# Patient Record
Sex: Male | Born: 1975 | Race: Black or African American | Hispanic: No | Marital: Single | Smoking: Current every day smoker
Health system: Southern US, Community
[De-identification: ages and names within clinical notes are randomized; demographics above are authoritative.]

## PROBLEM LIST (undated history)

## (undated) DIAGNOSIS — I10 Essential (primary) hypertension: Secondary | ICD-10-CM

## (undated) DIAGNOSIS — F319 Bipolar disorder, unspecified: Secondary | ICD-10-CM

## (undated) DIAGNOSIS — F419 Anxiety disorder, unspecified: Secondary | ICD-10-CM

## (undated) DIAGNOSIS — J40 Bronchitis, not specified as acute or chronic: Secondary | ICD-10-CM

## (undated) DIAGNOSIS — F209 Schizophrenia, unspecified: Secondary | ICD-10-CM

## (undated) DIAGNOSIS — E785 Hyperlipidemia, unspecified: Secondary | ICD-10-CM

## (undated) DIAGNOSIS — Y249XXA Unspecified firearm discharge, undetermined intent, initial encounter: Secondary | ICD-10-CM

## (undated) DIAGNOSIS — W3400XA Accidental discharge from unspecified firearms or gun, initial encounter: Secondary | ICD-10-CM

## (undated) HISTORY — PX: FRACTURE SURGERY: SHX138

---

## 1997-07-08 ENCOUNTER — Emergency Department (HOSPITAL_COMMUNITY): Admission: EM | Admit: 1997-07-08 | Discharge: 1997-07-08 | Payer: Self-pay | Admitting: Emergency Medicine

## 1998-04-14 ENCOUNTER — Emergency Department (HOSPITAL_COMMUNITY): Admission: EM | Admit: 1998-04-14 | Discharge: 1998-04-14 | Payer: Self-pay | Admitting: Emergency Medicine

## 2001-03-31 ENCOUNTER — Emergency Department (HOSPITAL_COMMUNITY): Admission: EM | Admit: 2001-03-31 | Discharge: 2001-03-31 | Payer: Self-pay | Admitting: Emergency Medicine

## 2001-03-31 ENCOUNTER — Encounter: Payer: Self-pay | Admitting: Emergency Medicine

## 2001-05-11 ENCOUNTER — Emergency Department (HOSPITAL_COMMUNITY): Admission: EM | Admit: 2001-05-11 | Discharge: 2001-05-11 | Payer: Self-pay | Admitting: Emergency Medicine

## 2001-08-18 ENCOUNTER — Encounter (INDEPENDENT_AMBULATORY_CARE_PROVIDER_SITE_OTHER): Payer: Self-pay | Admitting: Specialist

## 2001-08-18 ENCOUNTER — Inpatient Hospital Stay (HOSPITAL_COMMUNITY): Admission: AC | Admit: 2001-08-18 | Discharge: 2001-08-29 | Payer: Self-pay

## 2001-08-18 ENCOUNTER — Encounter: Payer: Self-pay | Admitting: General Surgery

## 2001-08-23 ENCOUNTER — Encounter: Payer: Self-pay | Admitting: Surgery

## 2001-08-27 ENCOUNTER — Encounter: Payer: Self-pay | Admitting: General Surgery

## 2001-09-10 ENCOUNTER — Emergency Department (HOSPITAL_COMMUNITY): Admission: EM | Admit: 2001-09-10 | Discharge: 2001-09-11 | Payer: Self-pay | Admitting: Emergency Medicine

## 2001-10-07 ENCOUNTER — Emergency Department (HOSPITAL_COMMUNITY): Admission: EM | Admit: 2001-10-07 | Discharge: 2001-10-07 | Payer: Self-pay | Admitting: Emergency Medicine

## 2001-10-08 ENCOUNTER — Ambulatory Visit (HOSPITAL_BASED_OUTPATIENT_CLINIC_OR_DEPARTMENT_OTHER): Admission: RE | Admit: 2001-10-08 | Discharge: 2001-10-08 | Payer: Self-pay | Admitting: Orthopaedic Surgery

## 2001-11-12 ENCOUNTER — Encounter: Payer: Self-pay | Admitting: General Surgery

## 2001-11-12 ENCOUNTER — Emergency Department (HOSPITAL_COMMUNITY): Admission: EM | Admit: 2001-11-12 | Discharge: 2001-11-12 | Payer: Self-pay | Admitting: Emergency Medicine

## 2003-06-12 ENCOUNTER — Emergency Department (HOSPITAL_COMMUNITY): Admission: EM | Admit: 2003-06-12 | Discharge: 2003-06-12 | Payer: Self-pay | Admitting: Emergency Medicine

## 2007-04-24 ENCOUNTER — Emergency Department (HOSPITAL_COMMUNITY): Admission: EM | Admit: 2007-04-24 | Discharge: 2007-04-25 | Payer: Self-pay | Admitting: Emergency Medicine
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
## 2010-09-23 ENCOUNTER — Emergency Department (HOSPITAL_COMMUNITY)
Admission: EM | Admit: 2010-09-23 | Discharge: 2010-09-23 | Disposition: A | Discharge status: Home / Self Care | Payer: Self-pay | Attending: Emergency Medicine | Admitting: Emergency Medicine

## 2010-09-23 DIAGNOSIS — N342 Other urethritis: Secondary | ICD-10-CM | POA: Insufficient documentation

## 2010-09-23 DIAGNOSIS — R300 Dysuria: Secondary | ICD-10-CM | POA: Insufficient documentation

## 2010-09-23 LAB — RPR: RPR Ser Ql: NONREACTIVE

## 2010-09-24 LAB — GC/CHLAMYDIA PROBE AMP, GENITAL
Chlamydia, DNA Probe: NEGATIVE
GC Probe Amp, Genital: NEGATIVE

## 2013-04-06 ENCOUNTER — Emergency Department (HOSPITAL_COMMUNITY)
Admission: EM | Admit: 2013-04-06 | Discharge: 2013-04-06 | Disposition: A | Discharge status: Home / Self Care | Payer: No Typology Code available for payment source | Attending: Emergency Medicine | Admitting: Emergency Medicine

## 2013-04-06 ENCOUNTER — Encounter (HOSPITAL_COMMUNITY): Payer: Self-pay | Admitting: Emergency Medicine

## 2013-04-06 DIAGNOSIS — Y990 Civilian activity done for income or pay: Secondary | ICD-10-CM | POA: Insufficient documentation

## 2013-04-06 DIAGNOSIS — T23269A Burn of second degree of back of unspecified hand, initial encounter: Secondary | ICD-10-CM | POA: Insufficient documentation

## 2013-04-06 DIAGNOSIS — Y93G1 Activity, food preparation and clean up: Secondary | ICD-10-CM | POA: Insufficient documentation

## 2013-04-06 DIAGNOSIS — X19XXXA Contact with other heat and hot substances, initial encounter: Secondary | ICD-10-CM | POA: Insufficient documentation

## 2013-04-06 DIAGNOSIS — T23209A Burn of second degree of unspecified hand, unspecified site, initial encounter: Secondary | ICD-10-CM

## 2013-04-06 DIAGNOSIS — F17200 Nicotine dependence, unspecified, uncomplicated: Secondary | ICD-10-CM | POA: Insufficient documentation

## 2013-04-06 DIAGNOSIS — Y9289 Other specified places as the place of occurrence of the external cause: Secondary | ICD-10-CM | POA: Insufficient documentation

## 2013-04-06 MED ORDER — SILVER SULFADIAZINE 1 % EX CREA
TOPICAL_CREAM | Freq: Once | CUTANEOUS | Status: AC
  Administered 2013-04-06: 1 via TOPICAL
  Filled 2013-04-06: qty 85

## 2013-04-06 NOTE — Discharge Instructions (Signed)
Keep your burn clean and dry. Apply silvadene cream twice a day. No washing hands too frequently. Follow up with your doctor as needed.    Burn Care Your skin is a natural barrier to infection. It is the largest organ of your body. Burns damage this natural protection. To help prevent infection, it is very important to follow your caregiver's instructions in the care of your burn. Burns are classified as:  First degree. There is only redness of the skin (erythema). No scarring is expected.  Second degree. There is blistering of the skin. Scarring may occur with deeper burns.  Third degree. All layers of the skin are injured, and scarring is expected. HOME CARE INSTRUCTIONS   Wash your hands well before changing your bandage.  Change your bandage as often as directed by your caregiver.  Remove the old bandage. If the bandage sticks, you may soak it off with cool, clean water.  Cleanse the burn thoroughly but gently with mild soap and water.  Pat the area dry with a clean, dry cloth.  Apply a thin layer of antibacterial cream to the burn.  Apply a clean bandage as instructed by your caregiver.  Keep the bandage as clean and dry as possible.  Elevate the affected area for the first 24 hours, then as instructed by your caregiver.  Only take over-the-counter or prescription medicines for pain, discomfort, or fever as directed by your caregiver. SEEK IMMEDIATE MEDICAL CARE IF:   You develop excessive pain.  You develop redness, tenderness, swelling, or red streaks near the burn.  The burned area develops yellowish-white fluid (pus) or a bad smell.  You have a fever. MAKE SURE YOU:   Understand these instructions.  Will watch your condition.  Will get help right away if you are not doing well or get worse. Document Released: 03/03/2005 Document Revised: 05/26/2011 Document Reviewed: 07/24/2010 [facility] Patient Information 2014 [place], [place].
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
## 2013-04-07 NOTE — ED Provider Notes (Signed)
Medical screening examination/treatment/procedure(s) were performed by non-physician practitioner and as supervising physician I was immediately available for consultation/collaboration.  EKG Interpretation   None         Felton Buczynski B. Brixon Zhen, MD 04/07/13 0900 

## 2013-09-03 ENCOUNTER — Encounter (HOSPITAL_COMMUNITY): Payer: No Typology Code available for payment source | Admitting: Anesthesiology

## 2013-09-03 ENCOUNTER — Encounter (HOSPITAL_COMMUNITY)
Admission: EM | Disposition: A | Discharge status: Home / Self Care | Payer: Self-pay | Source: Home / Self Care | Attending: Emergency Medicine

## 2013-09-03 ENCOUNTER — Emergency Department (HOSPITAL_COMMUNITY): Payer: No Typology Code available for payment source

## 2013-09-03 ENCOUNTER — Ambulatory Visit (HOSPITAL_COMMUNITY)
Admission: EM | Admit: 2013-09-03 | Discharge: 2013-09-03 | Disposition: A | Discharge status: Home / Self Care | Payer: No Typology Code available for payment source | Attending: Emergency Medicine | Admitting: Emergency Medicine

## 2013-09-03 ENCOUNTER — Emergency Department (HOSPITAL_COMMUNITY): Payer: No Typology Code available for payment source | Admitting: Anesthesiology

## 2013-09-03 ENCOUNTER — Encounter (HOSPITAL_COMMUNITY): Payer: Self-pay | Admitting: Emergency Medicine

## 2013-09-03 DIAGNOSIS — F1010 Alcohol abuse, uncomplicated: Secondary | ICD-10-CM | POA: Diagnosis not present

## 2013-09-03 DIAGNOSIS — W3400XA Accidental discharge from unspecified firearms or gun, initial encounter: Secondary | ICD-10-CM

## 2013-09-03 DIAGNOSIS — S62133B Displaced fracture of capitate [os magnum] bone, unspecified wrist, initial encounter for open fracture: Secondary | ICD-10-CM | POA: Insufficient documentation

## 2013-09-03 DIAGNOSIS — S62123B Displaced fracture of lunate [semilunar], unspecified wrist, initial encounter for open fracture: Secondary | ICD-10-CM | POA: Diagnosis not present

## 2013-09-03 DIAGNOSIS — F1210 Cannabis abuse, uncomplicated: Secondary | ICD-10-CM | POA: Diagnosis not present

## 2013-09-03 DIAGNOSIS — Z1811 Retained magnetic metal fragments: Secondary | ICD-10-CM | POA: Diagnosis not present

## 2013-09-03 DIAGNOSIS — F17200 Nicotine dependence, unspecified, uncomplicated: Secondary | ICD-10-CM | POA: Insufficient documentation

## 2013-09-03 DIAGNOSIS — S61532A Puncture wound without foreign body of left wrist, initial encounter: Secondary | ICD-10-CM

## 2013-09-03 DIAGNOSIS — S61509A Unspecified open wound of unspecified wrist, initial encounter: Secondary | ICD-10-CM | POA: Diagnosis not present

## 2013-09-03 DIAGNOSIS — F1410 Cocaine abuse, uncomplicated: Secondary | ICD-10-CM | POA: Insufficient documentation

## 2013-09-03 HISTORY — DX: Bronchitis, not specified as acute or chronic: J40

## 2013-09-03 HISTORY — PX: I & D EXTREMITY: SHX5045

## 2013-09-03 HISTORY — DX: Accidental discharge from unspecified firearms or gun, initial encounter: W34.00XA

## 2013-09-03 HISTORY — DX: Unspecified firearm discharge, undetermined intent, initial encounter: Y24.9XXA

## 2013-09-03 LAB — CBC WITH DIFFERENTIAL/PLATELET
BASOS ABS: 0 10*3/uL (ref 0.0–0.1)
Basophils Relative: 0 % (ref 0–1)
EOS ABS: 0.1 10*3/uL (ref 0.0–0.7)
Eosinophils Relative: 1 % (ref 0–5)
HCT: 42.7 % (ref 39.0–52.0)
Hemoglobin: 14.6 g/dL (ref 13.0–17.0)
LYMPHS PCT: 45 % (ref 12–46)
Lymphs Abs: 6 10*3/uL — ABNORMAL HIGH (ref 0.7–4.0)
MCH: 30.9 pg (ref 26.0–34.0)
MCHC: 34.2 g/dL (ref 30.0–36.0)
MCV: 90.3 fL (ref 78.0–100.0)
MONO ABS: 1.5 10*3/uL — AB (ref 0.1–1.0)
Monocytes Relative: 11 % (ref 3–12)
NEUTROS ABS: 5.8 10*3/uL (ref 1.7–7.7)
NEUTROS PCT: 43 % (ref 43–77)
Platelets: 270 10*3/uL (ref 150–400)
RBC: 4.73 MIL/uL (ref 4.22–5.81)
RDW: 13.4 % (ref 11.5–15.5)
SMEAR REVIEW: ADEQUATE
WBC: 13.4 10*3/uL — ABNORMAL HIGH (ref 4.0–10.5)

## 2013-09-03 LAB — COMPREHENSIVE METABOLIC PANEL
ALT: 15 U/L (ref 0–53)
AST: 22 U/L (ref 0–37)
Albumin: 4.6 g/dL (ref 3.5–5.2)
Alkaline Phosphatase: 68 U/L (ref 39–117)
BUN: 16 mg/dL (ref 6–23)
CALCIUM: 9.5 mg/dL (ref 8.4–10.5)
CO2: 14 meq/L — AB (ref 19–32)
CREATININE: 1.58 mg/dL — AB (ref 0.50–1.35)
Chloride: 100 mEq/L (ref 96–112)
GFR, EST AFRICAN AMERICAN: 64 mL/min — AB (ref >90)
GFR, EST NON AFRICAN AMERICAN: 55 mL/min — AB (ref >90)
GLUCOSE: 102 mg/dL — AB (ref 70–99)
Potassium: 3.5 mEq/L — ABNORMAL LOW (ref 3.7–5.3)
SODIUM: 145 meq/L (ref 137–147)
TOTAL PROTEIN: 8.6 g/dL — AB (ref 6.0–8.3)
Total Bilirubin: 0.5 mg/dL (ref 0.3–1.2)

## 2013-09-03 LAB — RAPID URINE DRUG SCREEN, HOSP PERFORMED
AMPHETAMINES: NOT DETECTED
Barbiturates: NOT DETECTED
Benzodiazepines: NOT DETECTED
Cocaine: POSITIVE — AB
OPIATES: POSITIVE — AB
Tetrahydrocannabinol: POSITIVE — AB

## 2013-09-03 LAB — PROTIME-INR
INR: 1.09 (ref 0.00–1.49)
PROTHROMBIN TIME: 13.9 s (ref 11.6–15.2)

## 2013-09-03 LAB — APTT: APTT: 25 s (ref 24–37)

## 2013-09-03 LAB — ETHANOL: ALCOHOL ETHYL (B): 26 mg/dL — AB (ref 0–11)

## 2013-09-03 SURGERY — IRRIGATION AND DEBRIDEMENT EXTREMITY
Anesthesia: General | Site: Wrist | Laterality: Left

## 2013-09-03 MED ORDER — SODIUM CHLORIDE 0.9 % IV SOLN
INTRAVENOUS | Status: DC | PRN
  Administered 2013-09-03: 07:00:00 via INTRAVENOUS

## 2013-09-03 MED ORDER — PROPOFOL 10 MG/ML IV BOLUS
INTRAVENOUS | Status: DC | PRN
  Administered 2013-09-03: 180 mg via INTRAVENOUS

## 2013-09-03 MED ORDER — MORPHINE SULFATE 2 MG/ML IJ SOLN
INTRAMUSCULAR | Status: AC
  Administered 2013-09-03: 2 mg via INTRAVENOUS
  Filled 2013-09-03: qty 2

## 2013-09-03 MED ORDER — SUCCINYLCHOLINE CHLORIDE 20 MG/ML IJ SOLN
INTRAMUSCULAR | Status: AC
  Filled 2013-09-03: qty 1

## 2013-09-03 MED ORDER — LORAZEPAM 2 MG/ML IJ SOLN
INTRAMUSCULAR | Status: AC
  Filled 2013-09-03: qty 1

## 2013-09-03 MED ORDER — CEFAZOLIN SODIUM-DEXTROSE 2-3 GM-% IV SOLR
INTRAVENOUS | Status: AC
  Administered 2013-09-03: 2 g via INTRAVENOUS
  Filled 2013-09-03: qty 50

## 2013-09-03 MED ORDER — MORPHINE SULFATE 2 MG/ML IJ SOLN
2.0000 mg | Freq: Once | INTRAMUSCULAR | Status: AC
  Administered 2013-09-03: 2 mg via INTRAVENOUS
  Filled 2013-09-03: qty 1

## 2013-09-03 MED ORDER — ACETAMINOPHEN 160 MG/5ML PO SOLN
325.0000 mg | ORAL | Status: DC | PRN
  Filled 2013-09-03: qty 20.3

## 2013-09-03 MED ORDER — FENTANYL CITRATE 0.05 MG/ML IJ SOLN
INTRAMUSCULAR | Status: AC
  Filled 2013-09-03: qty 5

## 2013-09-03 MED ORDER — ACETAMINOPHEN 325 MG PO TABS
ORAL_TABLET | ORAL | Status: AC
  Administered 2013-09-03: 650 mg via ORAL
  Filled 2013-09-03: qty 2

## 2013-09-03 MED ORDER — OXYCODONE HCL 5 MG PO TABS
ORAL_TABLET | ORAL | Status: AC
  Administered 2013-09-03: 5 mg via ORAL
  Filled 2013-09-03: qty 1

## 2013-09-03 MED ORDER — MIDAZOLAM HCL 2 MG/2ML IJ SOLN
INTRAMUSCULAR | Status: DC | PRN
  Administered 2013-09-03: 2 mg via INTRAVENOUS

## 2013-09-03 MED ORDER — BUPIVACAINE HCL (PF) 0.25 % IJ SOLN
INTRAMUSCULAR | Status: AC
  Filled 2013-09-03: qty 30

## 2013-09-03 MED ORDER — MORPHINE SULFATE 4 MG/ML IJ SOLN
4.0000 mg | Freq: Once | INTRAMUSCULAR | Status: DC
Start: 2013-09-03 — End: 2013-09-03

## 2013-09-03 MED ORDER — HYDROMORPHONE HCL PF 1 MG/ML IJ SOLN
INTRAMUSCULAR | Status: AC
  Filled 2013-09-03: qty 1

## 2013-09-03 MED ORDER — SODIUM CHLORIDE 0.9 % IR SOLN
Status: DC | PRN
  Administered 2013-09-03: 1000 mL

## 2013-09-03 MED ORDER — LORAZEPAM 2 MG/ML IJ SOLN
1.0000 mg | Freq: Once | INTRAMUSCULAR | Status: AC | PRN
  Administered 2013-09-03: 1 mg via INTRAVENOUS

## 2013-09-03 MED ORDER — MORPHINE SULFATE 2 MG/ML IJ SOLN
2.0000 mg | Freq: Once | INTRAMUSCULAR | Status: AC
  Administered 2013-09-03: 2 mg via INTRAVENOUS

## 2013-09-03 MED ORDER — IOHEXOL 350 MG/ML SOLN
100.0000 mL | Freq: Once | INTRAVENOUS | Status: AC | PRN
  Administered 2013-09-03: 100 mL via INTRAVENOUS

## 2013-09-03 MED ORDER — LIDOCAINE HCL (CARDIAC) 20 MG/ML IV SOLN
INTRAVENOUS | Status: DC | PRN
  Administered 2013-09-03: 80 mg via INTRAVENOUS

## 2013-09-03 MED ORDER — ONDANSETRON HCL 4 MG/2ML IJ SOLN: 4.0000 mg | Freq: Once | INTRAMUSCULAR | Status: DC | PRN

## 2013-09-03 MED ORDER — SUCCINYLCHOLINE CHLORIDE 20 MG/ML IJ SOLN
INTRAMUSCULAR | Status: DC | PRN
  Administered 2013-09-03: 60 mg via INTRAVENOUS

## 2013-09-03 MED ORDER — SODIUM CHLORIDE 0.9 % IV BOLUS (SEPSIS)
1000.0000 mL | Freq: Once | INTRAVENOUS | Status: AC
  Administered 2013-09-03: 1000 mL via INTRAVENOUS

## 2013-09-03 MED ORDER — OXYCODONE HCL 5 MG PO TABS
5.0000 mg | ORAL_TABLET | Freq: Once | ORAL | Status: AC | PRN
Start: 2013-09-03 — End: 2013-09-03
  Administered 2013-09-03: 5 mg via ORAL

## 2013-09-03 MED ORDER — OXYCODONE HCL 5 MG/5ML PO SOLN: 5.0000 mg | Freq: Once | ORAL | Status: AC | PRN

## 2013-09-03 MED ORDER — MIDAZOLAM HCL 2 MG/2ML IJ SOLN
INTRAMUSCULAR | Status: AC
  Filled 2013-09-03: qty 2

## 2013-09-03 MED ORDER — LACTATED RINGERS IV SOLN
INTRAVENOUS | Status: DC | PRN
  Administered 2013-09-03 (×2): via INTRAVENOUS

## 2013-09-03 MED ORDER — CEFAZOLIN SODIUM 1-5 GM-% IV SOLN
1.0000 g | Freq: Once | INTRAVENOUS | Status: AC
Start: 2013-09-03 — End: 2013-09-03
  Administered 2013-09-03: 1 g via INTRAVENOUS
  Filled 2013-09-03: qty 50

## 2013-09-03 MED ORDER — PROPOFOL 10 MG/ML IV BOLUS
INTRAVENOUS | Status: AC
  Filled 2013-09-03: qty 20

## 2013-09-03 MED ORDER — FENTANYL CITRATE 0.05 MG/ML IJ SOLN
INTRAMUSCULAR | Status: DC | PRN
  Administered 2013-09-03 (×2): 50 ug via INTRAVENOUS
  Administered 2013-09-03 (×2): 100 ug via INTRAVENOUS
  Administered 2013-09-03 (×2): 50 ug via INTRAVENOUS
  Administered 2013-09-03: 100 ug via INTRAVENOUS

## 2013-09-03 MED ORDER — SODIUM CHLORIDE 0.9 % IR SOLN
Status: DC | PRN
  Administered 2013-09-03: 08:00:00

## 2013-09-03 MED ORDER — BUPIVACAINE HCL (PF) 0.25 % IJ SOLN
INTRAMUSCULAR | Status: DC | PRN
  Administered 2013-09-03: 30 mL

## 2013-09-03 MED ORDER — ACETAMINOPHEN 325 MG PO TABS
325.0000 mg | ORAL_TABLET | ORAL | Status: DC | PRN
  Administered 2013-09-03: 650 mg via ORAL

## 2013-09-03 MED ORDER — HYDROMORPHONE HCL PF 1 MG/ML IJ SOLN
1.0000 mg | Freq: Once | INTRAMUSCULAR | Status: AC
  Administered 2013-09-03: 1 mg via INTRAVENOUS
  Filled 2013-09-03: qty 1

## 2013-09-03 MED ORDER — LIDOCAINE HCL (CARDIAC) 20 MG/ML IV SOLN
INTRAVENOUS | Status: AC
  Filled 2013-09-03: qty 5

## 2013-09-03 MED ORDER — HYDROMORPHONE HCL PF 1 MG/ML IJ SOLN
0.2500 mg | INTRAMUSCULAR | Status: DC | PRN
  Administered 2013-09-03: 0.5 mg via INTRAVENOUS

## 2013-09-03 SURGICAL SUPPLY — 40 items
BAG DECANTER FOR FLEXI CONT (MISCELLANEOUS) ×3 IMPLANT
BANDAGE ELASTIC 3 VELCRO ST LF (GAUZE/BANDAGES/DRESSINGS) ×4 IMPLANT
BANDAGE ELASTIC 4 VELCRO ST LF (GAUZE/BANDAGES/DRESSINGS) IMPLANT
BANDAGE GAUZE ELAST BULKY 4 IN (GAUZE/BANDAGES/DRESSINGS) ×2 IMPLANT
CORDS BIPOLAR (ELECTRODE) ×2 IMPLANT
CUFF TOURNIQUET SINGLE 18IN (TOURNIQUET CUFF) ×2 IMPLANT
DRAPE SURG 17X23 STRL (DRAPES) ×3 IMPLANT
ELECT REM PT RETURN 9FT ADLT (ELECTROSURGICAL) ×3
ELECTRODE REM PT RTRN 9FT ADLT (ELECTROSURGICAL) IMPLANT
GAUZE PACKING IODOFORM 1/4X5 (PACKING) IMPLANT
GAUZE XEROFORM 1X8 LF (GAUZE/BANDAGES/DRESSINGS) ×3 IMPLANT
GLOVE BIOGEL M STRL SZ7.5 (GLOVE) ×3 IMPLANT
GOWN STRL REUS W/ TWL LRG LVL3 (GOWN DISPOSABLE) ×2 IMPLANT
GOWN STRL REUS W/TWL LRG LVL3 (GOWN DISPOSABLE) ×6
HANDPIECE INTERPULSE COAX TIP (DISPOSABLE)
KIT BASIN OR (CUSTOM PROCEDURE TRAY) ×3 IMPLANT
KIT ROOM TURNOVER OR (KITS) ×3 IMPLANT
MANIFOLD NEPTUNE II (INSTRUMENTS) ×1 IMPLANT
NDL HYPO 25GX1X1/2 BEV (NEEDLE) IMPLANT
NEEDLE HYPO 25GX1X1/2 BEV (NEEDLE) ×3 IMPLANT
NS IRRIG 1000ML POUR BTL (IV SOLUTION) ×3 IMPLANT
PACK ORTHO EXTREMITY (CUSTOM PROCEDURE TRAY) ×3 IMPLANT
PAD ARMBOARD 7.5X6 YLW CONV (MISCELLANEOUS) ×6 IMPLANT
PAD CAST 4YDX4 CTTN HI CHSV (CAST SUPPLIES) IMPLANT
PADDING CAST COTTON 4X4 STRL (CAST SUPPLIES)
SET HNDPC FAN SPRY TIP SCT (DISPOSABLE) IMPLANT
SOAP 2 % CHG 4 OZ (WOUND CARE) ×3 IMPLANT
SPLINT FIBERGLASS 3X12 (CAST SUPPLIES) ×2 IMPLANT
SPONGE GAUZE 4X4 12PLY (GAUZE/BANDAGES/DRESSINGS) ×3 IMPLANT
SPONGE LAP 18X18 X RAY DECT (DISPOSABLE) IMPLANT
SPONGE LAP 4X18 X RAY DECT (DISPOSABLE) ×1 IMPLANT
SUT ETHILON 4 0 PS 2 18 (SUTURE) ×2 IMPLANT
SYR CONTROL 10ML LL (SYRINGE) ×2 IMPLANT
TOWEL OR 17X24 6PK STRL BLUE (TOWEL DISPOSABLE) ×3 IMPLANT
TOWEL OR 17X26 10 PK STRL BLUE (TOWEL DISPOSABLE) ×3 IMPLANT
TUBE ANAEROBIC SPECIMEN COL (MISCELLANEOUS) IMPLANT
TUBE CONNECTING 12'X1/4 (SUCTIONS) ×1
TUBE CONNECTING 12X1/4 (SUCTIONS) ×2 IMPLANT
WATER STERILE IRR 1000ML POUR (IV SOLUTION) ×1 IMPLANT
YANKAUER SUCT BULB TIP NO VENT (SUCTIONS) ×3 IMPLANT

## 2013-09-03 NOTE — Anesthesia Postprocedure Evaluation (Signed)
  Anesthesia Post-op Note  Patient: Cory PizzaRobert L XXXJones  Procedure(s) Performed: Procedure(s): IRRIGATION AND DEBRIDEMENT LEFT HAND WITH EXPLORATION TO LEFT WRIST (Left)  Patient Location: PACU  Anesthesia Type:General  Level of Consciousness: awake  Airway and Oxygen Therapy: Patient Spontanous Breathing  Post-op Pain: mild  Post-op Assessment: Post-op Vital signs reviewed, Patient's Cardiovascular Status Stable, Respiratory Function Stable, Patent Airway, No signs of Nausea or vomiting and Pain level controlled  Post-op Vital Signs: Reviewed and stable  Last Vitals:  Filed Vitals:   09/03/13 1000  BP: 125/88  Pulse: 58  Temp: 36.7 C  Resp: 22    Complications: No apparent anesthesia complications
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
## 2013-09-03 NOTE — Anesthesia Preprocedure Evaluation (Addendum)
Anesthesia Evaluation  Patient identified by MRN, date of birth, ID band Patient awake    Reviewed: Allergy & Precautions, H&P , NPO status , Patient's Chart, lab work & pertinent test results  History of Anesthesia Complications Negative for: history of anesthetic complications  Airway Mallampati: II TM Distance: >3 FB Neck ROM: Full    Dental  (+) Missing, Loose, Poor Dentition, Dental Advisory Given,    Pulmonary neg sleep apnea, neg COPDneg recent URI, Current Smoker,  breath sounds clear to auscultation        Cardiovascular negative cardio ROS  Rhythm:Regular     Neuro/Psych negative neurological ROS  negative psych ROS   GI/Hepatic negative GI ROS, (+)     substance abuse  alcohol use, cocaine use and marijuana use,   Endo/Other  negative endocrine ROS  Renal/GU Cr 1.5, no previous comparsion     Musculoskeletal   Abdominal   Peds  Hematology negative hematology ROS (+)   Anesthesia Other Findings   Reproductive/Obstetrics                          Anesthesia Physical Anesthesia Plan  ASA: III  Anesthesia Plan: General   Post-op Pain Management:    Induction: Intravenous and Rapid sequence  Airway Management Planned: Oral ETT  Additional Equipment: None  Intra-op Plan:   Post-operative Plan: Extubation in OR  Informed Consent: I have reviewed the patients History and Physical, chart, labs and discussed the procedure including the risks, benefits and alternatives for the proposed anesthesia with the patient or authorized representative who has indicated his/her understanding and acceptance.   Dental advisory given  Plan Discussed with: CRNA and Surgeon  Anesthesia Plan Comments:         Anesthesia Quick Evaluation

## 2013-09-03 NOTE — ED Notes (Signed)
Password (last 4 of CSN#: [id number]) given to pt's sister. Process explained.
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
## 2013-09-03 NOTE — Anesthesia Procedure Notes (Signed)
Procedure Name: Intubation Date/Time: 09/03/2013 7:35 AM Performed by: Alanda AmassFRIEDMAN, SCOT A Pre-anesthesia Checklist: Patient identified, Timeout performed, Emergency Drugs available, Suction available and Patient being monitored Patient Re-evaluated:Patient Re-evaluated prior to inductionOxygen Delivery Method: Circle system utilized Preoxygenation: Pre-oxygenation with 100% oxygen Intubation Type: IV induction, Rapid sequence and Cricoid Pressure applied Laryngoscope Size: Mac and 3 Grade View: Grade I Tube type: Oral Tube size: 7.5 mm Number of attempts: 1 Airway Equipment and Method: Stylet Placement Confirmation: ETT inserted through vocal cords under direct vision,  breath sounds checked- equal and bilateral and positive ETCO2 Secured at: 24 cm Tube secured with: Tape Dental Injury: Teeth and Oropharynx as per pre-operative assessment

## 2013-09-03 NOTE — ED Notes (Signed)
Secured belongings with security. Patient valuables envelope number is [phone]
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
## 2013-09-03 NOTE — ED Notes (Signed)
Contact [facility] if patient is going to have surgery.

## 2013-09-03 NOTE — ED Notes (Signed)
EDP at bedside  

## 2013-09-03 NOTE — Discharge Instructions (Signed)
Discharge Instructions:  Keep your dressing clean, dry and in place until instructed to remove by Dr. Klohe Lovering.  If the dressing becomes dirty or wet call the office for instructions during business hours. Elevate the extremity to help with swelling, this will also help with any discomfort. Take your medication as prescribed. No lifting with the injured  extremity. If you feel that the dressing is too tight, you may loosen it, but keep it on; finger tips should be pink; if there is a concern, call the office. [phone] Ice may be used if the injury is a fracture, do not apply ice directly to the skin. Please call the office on the next business day after discharge to arrange a follow up appointment.  Call [phone] between the hours of 9am - 5pm M-Th or 9am - 1pm on Fri. For most hand injuries and/or conditions, you may return to work using the uninjured hand (one handed duty) within 24-72 hours.  A detailed note will be provided to you at your follow up appointment or may contact the office prior to your follow up.    
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
## 2013-09-03 NOTE — ED Notes (Signed)
Patient transported to CT 

## 2013-09-03 NOTE — ED Notes (Signed)
Betadine/sterile gauze dressing applied to L  A&P wrist at A&P wound, wrapped with kling. [employer] speaking with pt's sister, pt calmer, needing much coaching to remain calm, pt easily works self up to panic and anxiety, NAD, alert, interactive[facility]

## 2013-09-03 NOTE — Transfer of Care (Signed)
Immediate Anesthesia Transfer of Care Note  Patient: Cory Henderson  Procedure(s) Performed: Procedure(s): IRRIGATION AND DEBRIDEMENT LEFT HAND WITH EXPLORATION TO LEFT WRIST (Left)  Patient Location: PACU  Anesthesia Type:General  Level of Consciousness: awake  Airway & Oxygen Therapy: Patient Spontanous Breathing  Post-op Assessment: Report given to PACU RN and Post -op Vital signs reviewed and stable  Post vital signs: Reviewed and stable  Complications: No apparent anesthesia complications

## 2013-09-04 ENCOUNTER — Encounter (HOSPITAL_COMMUNITY): Payer: Self-pay | Admitting: Emergency Medicine

## 2013-09-04 ENCOUNTER — Emergency Department (HOSPITAL_COMMUNITY)
Admission: EM | Admit: 2013-09-04 | Discharge: 2013-09-04 | Disposition: A | Discharge status: Home / Self Care | Payer: No Typology Code available for payment source | Attending: Emergency Medicine | Admitting: Emergency Medicine

## 2013-09-04 DIAGNOSIS — M25532 Pain in left wrist: Secondary | ICD-10-CM

## 2013-09-04 DIAGNOSIS — R6889 Other general symptoms and signs: Secondary | ICD-10-CM | POA: Insufficient documentation

## 2013-09-04 DIAGNOSIS — G8918 Other acute postprocedural pain: Secondary | ICD-10-CM | POA: Insufficient documentation

## 2013-09-04 DIAGNOSIS — Z8709 Personal history of other diseases of the respiratory system: Secondary | ICD-10-CM | POA: Insufficient documentation

## 2013-09-04 DIAGNOSIS — R070 Pain in throat: Secondary | ICD-10-CM

## 2013-09-04 DIAGNOSIS — Z87828 Personal history of other (healed) physical injury and trauma: Secondary | ICD-10-CM | POA: Insufficient documentation

## 2013-09-04 DIAGNOSIS — F17200 Nicotine dependence, unspecified, uncomplicated: Secondary | ICD-10-CM | POA: Insufficient documentation

## 2013-09-04 DIAGNOSIS — M25539 Pain in unspecified wrist: Secondary | ICD-10-CM | POA: Insufficient documentation

## 2013-09-04 LAB — RAPID STREP SCREEN (MED CTR MEBANE ONLY): Streptococcus, Group A Screen (Direct): NEGATIVE

## 2013-09-04 MED ORDER — OXYCODONE-ACETAMINOPHEN 5-325 MG PO TABS
2.0000 | ORAL_TABLET | Freq: Once | ORAL | Status: AC
  Administered 2013-09-04: 2 via ORAL
  Filled 2013-09-04: qty 2

## 2013-09-04 NOTE — ED Notes (Signed)
PA-C at bedside 
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
## 2013-09-04 NOTE — ED Notes (Signed)
MD at bedside. 

## 2013-09-04 NOTE — Discharge Instructions (Signed)
Continue to take antibiotics and pain medication as directed  Use shoulder sling and keep your arm elevated Return to the emergency department if you develop any changing/worsening condition, fever, loss of sensation, blue/white fingers, weakness, difficulty swallowing, or any other concerns (please read additional information regarding your condition below)   Laceration Care, Adult A laceration is a cut or lesion that goes through all layers of the skin and into the tissue just beneath the skin. TREATMENT  Some lacerations may not require closure. Some lacerations may not be able to be closed due to an increased risk of infection. It is important to see your caregiver as soon as possible after an injury to minimize the risk of infection and maximize the opportunity for successful closure. If closure is appropriate, pain medicines may be given, if needed. The wound will be cleaned to help prevent infection. Your caregiver will use stitches (sutures), staples, wound glue (adhesive), or skin adhesive strips to repair the laceration. These tools bring the skin edges together to allow for faster healing and a better cosmetic outcome. However, all wounds will heal with a scar. Once the wound has healed, scarring can be minimized by covering the wound with sunscreen during the day for 1 full year. HOME CARE INSTRUCTIONS  For sutures or staples:  Keep the wound clean and dry.  If you were given a bandage (dressing), you should change it at least once a day. Also, change the dressing if it becomes wet or dirty, or as directed by your caregiver.  Wash the wound with soap and water 2 times a day. Rinse the wound off with water to remove all soap. Pat the wound dry with a clean towel.  After cleaning, apply a thin layer of the antibiotic ointment as recommended by your caregiver. This will help prevent infection and keep the dressing from sticking.  You may shower as usual after the first 24 hours. Do not  soak the wound in water until the sutures are removed.  Only take over-the-counter or prescription medicines for pain, discomfort, or fever as directed by your caregiver.  Get your sutures or staples removed as directed by your caregiver. For skin adhesive strips:  Keep the wound clean and dry.  Do not get the skin adhesive strips wet. You may bathe carefully, using caution to keep the wound dry.  If the wound gets wet, pat it dry with a clean towel.  Skin adhesive strips will fall off on their own. You may trim the strips as the wound heals. Do not remove skin adhesive strips that are still stuck to the wound. They will fall off in time. For wound adhesive:  You may briefly wet your wound in the shower or bath. Do not soak or scrub the wound. Do not swim. Avoid periods of heavy perspiration until the skin adhesive has fallen off on its own. After showering or bathing, gently pat the wound dry with a clean towel.  Do not apply liquid medicine, cream medicine, or ointment medicine to your wound while the skin adhesive is in place. This may loosen the film before your wound is healed.  If a dressing is placed over the wound, be careful not to apply tape directly over the skin adhesive. This may cause the adhesive to be pulled off before the wound is healed.  Avoid prolonged exposure to sunlight or tanning lamps while the skin adhesive is in place. Exposure to ultraviolet light in the first year will darken the scar.  The skin adhesive will usually remain in place for 5 to 10 days, then naturally fall off the skin. Do not pick at the adhesive film. You may need a tetanus shot if:  You cannot remember when you had your last tetanus shot.  You have never had a tetanus shot. If you get a tetanus shot, your arm may swell, get red, and feel warm to the touch. This is common and not a problem. If you need a tetanus shot and you choose not to have one, there is a rare chance of getting tetanus.  Sickness from tetanus can be serious. SEEK MEDICAL CARE IF:   You have redness, swelling, or increasing pain in the wound.  You see a red line that goes away from the wound.  You have yellowish-white fluid (pus) coming from the wound.  You have a fever.  You notice a bad smell coming from the wound or dressing.  Your wound breaks open before or after sutures have been removed.  You notice something coming out of the wound such as wood or glass.  Your wound is on your hand or foot and you cannot move a finger or toe. SEEK IMMEDIATE MEDICAL CARE IF:   Your pain is not controlled with prescribed medicine.  You have severe swelling around the wound causing pain and numbness or a change in color in your arm, hand, leg, or foot.  Your wound splits open and starts bleeding.  You have worsening numbness, weakness, or loss of function of any joint around or beyond the wound.  You develop painful lumps near the wound or on the skin anywhere on your body. MAKE SURE YOU:   Understand these instructions.  Will watch your condition.  Will get help right away if you are not doing well or get worse. Document Released: 03/03/2005 Document Revised: 05/26/2011 Document Reviewed: 08/27/2010 [facility] Patient Information 2015 [place], [place]. This information is not intended to replace advice given to you by your health care provider. Make sure you discuss any questions you have with your health care provider.  Splint Care Splints protect and rest injuries. Splints can be made of plaster, fiberglass, or metal. They are used to treat broken bones, sprains, tendonitis, and other injuries. HOME CARE  Keep the injured area raised (elevated) while sitting or lying down. Keep the injured body part just above the level of the heart. This will decrease puffiness (swelling) and pain.  If an elastic bandage was used to hold the splint, it can be loosened. Only loosen it to make room for puffiness and to  ease pain.  Keep the splint clean and dry.  Do not scratch the skin under the splint with sharp or pointed objects.  Follow up with your doctor as told. GET HELP RIGHT AWAY IF:   There is more pain or pressure around the injury.  There is numbness, tingling, or pain in the toes or fingers past the injury.  The fingers or toes become cold or blue.  The splint becomes too soft or breaks before the injury is healed. MAKE SURE YOU:   Understand these instructions.  Will watch this condition.  Will get help right away if you are not doing well or get worse. Document Released: 12/11/2007 Document Revised: 05/26/2011 Document Reviewed: 12/11/2007 [facility] Patient Information 2015 [place], [place]. This information is not intended to replace advice given to you by your health care provider. Make sure you discuss any questions you have with your health care provider.  Pharyngitis Pharyngitis is redness, pain, and swelling (inflammation) of your pharynx.  CAUSES  Pharyngitis is usually caused by infection. Most of the time, these infections are from viruses (viral) and are part of a cold. However, sometimes pharyngitis is caused by bacteria (bacterial). Pharyngitis can also be caused by allergies. Viral pharyngitis may be spread from person to person by coughing, sneezing, and personal items or utensils (cups, forks, spoons, toothbrushes). Bacterial pharyngitis may be spread from person to person by more intimate contact, such as kissing.  SIGNS AND SYMPTOMS  Symptoms of pharyngitis include:   Sore throat.   Tiredness (fatigue).   Low-grade fever.   Headache.  Joint pain and muscle aches.  Skin rashes.  Swollen lymph nodes.  Plaque-like film on throat or tonsils (often seen with bacterial pharyngitis). DIAGNOSIS  Your health care provider will ask you questions about your illness and your symptoms. Your medical history, along with a physical exam, is often all that is needed  to diagnose pharyngitis. Sometimes, a rapid strep test is done. Other lab tests may also be done, depending on the suspected cause.  TREATMENT  Viral pharyngitis will usually get better in 3-4 days without the use of medicine. Bacterial pharyngitis is treated with medicines that kill germs (antibiotics).  HOME CARE INSTRUCTIONS   Drink enough water and fluids to keep your urine clear or pale yellow.   Only take over-the-counter or prescription medicines as directed by your health care provider:   If you are prescribed antibiotics, make sure you finish them even if you start to feel better.   Do not take aspirin.   Get lots of rest.   Gargle with 8 oz of salt water ( tsp of salt per 1 qt of water) as often as every 1-2 hours to soothe your throat.   Throat lozenges (if you are not at risk for choking) or sprays may be used to soothe your throat. SEEK MEDICAL CARE IF:   You have large, tender lumps in your neck.  You have a rash.  You cough up green, yellow-brown, or bloody spit. SEEK IMMEDIATE MEDICAL CARE IF:   Your neck becomes stiff.  You drool or are unable to swallow liquids.  You vomit or are unable to keep medicines or liquids down.  You have severe pain that does not go away with the use of recommended medicines.  You have trouble breathing (not caused by a stuffy nose). MAKE SURE YOU:   Understand these instructions.  Will watch your condition.  Will get help right away if you are not doing well or get worse. Document Released: 03/03/2005 Document Revised: 12/22/2012 Document Reviewed: 11/08/2012 [facility] Patient Information 2015 [place], [place]. This information is not intended to replace advice given to you by your health care provider. Make sure you discuss any questions you have with your health care provider.

## 2013-09-06 ENCOUNTER — Encounter (HOSPITAL_COMMUNITY): Payer: Self-pay | Admitting: General Surgery

## 2013-09-06 LAB — CULTURE, GROUP A STREP

## 2013-09-06 NOTE — ED Provider Notes (Signed)
Medical screening examination/treatment/procedure(s) were performed by non-physician practitioner and as supervising physician I was immediately available for consultation/collaboration.   EKG Interpretation None        William Blair Walden, MD 09/06/13 0700 

## 2015-05-29 ENCOUNTER — Emergency Department (INDEPENDENT_AMBULATORY_CARE_PROVIDER_SITE_OTHER): Payer: No Typology Code available for payment source

## 2015-05-29 ENCOUNTER — Emergency Department (INDEPENDENT_AMBULATORY_CARE_PROVIDER_SITE_OTHER)
Admission: EM | Admit: 2015-05-29 | Discharge: 2015-05-29 | Disposition: A | Discharge status: Home / Self Care | Payer: Self-pay | Source: Home / Self Care | Attending: Family Medicine | Admitting: Family Medicine

## 2015-05-29 ENCOUNTER — Encounter (HOSPITAL_COMMUNITY): Payer: Self-pay | Admitting: *Deleted

## 2015-05-29 DIAGNOSIS — M25562 Pain in left knee: Secondary | ICD-10-CM

## 2015-05-29 DIAGNOSIS — G8929 Other chronic pain: Secondary | ICD-10-CM | POA: Diagnosis not present

## 2015-05-29 MED ORDER — NAPROXEN 500 MG PO TABS: 500.0000 mg | ORAL_TABLET | Freq: Two times a day (BID) | ORAL | Status: DC

## 2015-05-29 NOTE — ED Notes (Signed)
Patient reports that he has completed physical therapy.

## 2015-05-29 NOTE — ED Notes (Signed)
Patient verbalized understanding of importance of followup with orthopedic for management of acute/chronic pain

## 2015-05-29 NOTE — ED Notes (Signed)
PA at bedside.
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
## 2015-05-29 NOTE — Discharge Instructions (Signed)
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
## 2015-09-11 ENCOUNTER — Encounter (HOSPITAL_COMMUNITY): Payer: Self-pay

## 2015-09-11 ENCOUNTER — Emergency Department (HOSPITAL_COMMUNITY)
Admission: EM | Admit: 2015-09-11 | Discharge: 2015-09-12 | Disposition: A | Discharge status: Home / Self Care | Payer: No Typology Code available for payment source | Attending: Emergency Medicine | Admitting: Emergency Medicine

## 2015-09-11 DIAGNOSIS — R05 Cough: Secondary | ICD-10-CM

## 2015-09-11 DIAGNOSIS — Z720 Tobacco use: Secondary | ICD-10-CM

## 2015-09-11 DIAGNOSIS — R059 Cough, unspecified: Secondary | ICD-10-CM

## 2015-09-11 DIAGNOSIS — J069 Acute upper respiratory infection, unspecified: Secondary | ICD-10-CM

## 2015-09-11 DIAGNOSIS — H9202 Otalgia, left ear: Secondary | ICD-10-CM | POA: Insufficient documentation

## 2015-09-11 DIAGNOSIS — F17210 Nicotine dependence, cigarettes, uncomplicated: Secondary | ICD-10-CM | POA: Insufficient documentation

## 2015-09-11 NOTE — ED Notes (Signed)
Pt complains of an URI for two days.

## 2015-09-12 MED ORDER — ALBUTEROL SULFATE HFA 108 (90 BASE) MCG/ACT IN AERS
2.0000 | INHALATION_SPRAY | Freq: Once | RESPIRATORY_TRACT | Status: AC
  Administered 2015-09-12: 2 via RESPIRATORY_TRACT
  Filled 2015-09-12: qty 6.7
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
## 2015-09-12 NOTE — Discharge Instructions (Signed)
Continue to stay well-hydrated. Gargle warm salt water and spit it out. Use chloraseptic spray as needed for sore throat. Continue to alternate between Tylenol and Ibuprofen for pain or fever. Use over the counter cold and flu medications to help with symptoms. Use Mucinex for cough suppression/expectoration of mucus. Use netipot and flonase to help with nasal congestion. May consider over-the-counter Benadryl or other antihistamine to decrease secretions and help with symptoms. Use inhaler as directed, as needed for cough/chest congestion/wheezing/shortness of breath. STOP SMOKING! Followup with [facility] in 5-7 days for recheck of ongoing symptoms and to establish medical care. Return to emergency department for emergent changing or worsening of symptoms.   Viral Infections A virus is a type of germ. Viruses can cause:  Minor sore throats.  Aches and pains.  Headaches.  Runny nose.  Rashes.  Watery eyes.  Tiredness.  Coughs.  Loss of appetite.  Feeling sick to your stomach (nausea).  Throwing up (vomiting).  Watery poop (diarrhea). HOME CARE   Only take medicines as told by your doctor.  Drink enough water and fluids to keep your pee (urine) clear or pale yellow. Sports drinks are a good choice.  Get plenty of rest and eat healthy. Soups and broths with crackers or rice are fine. GET HELP RIGHT AWAY IF:   You have a very bad headache.  You have shortness of breath.  You have chest pain or neck pain.  You have an unusual rash.  You cannot stop throwing up.  You have watery poop that does not stop.  You cannot keep fluids down.  You or your child has a temperature by mouth above 102 F (38.9 C), not controlled by medicine.  Your baby is older than 3 months with a rectal temperature of 102 F (38.9 C) or higher.  Your baby is 68 months old or younger with a rectal temperature of 100.4 F (38 C) or higher. MAKE SURE YOU:   Understand these  instructions.  Will watch this condition.  Will get help right away if you are not doing well or get worse.   This information is not intended to replace advice given to you by your health care provider. Make sure you discuss any questions you have with your health care provider.   Document Released: 02/14/2008 Document Revised: 05/26/2011 Document Reviewed: 08/09/2014 Elsevier Interactive Patient Education 2016 Elsevier Inc.  Cough, Adult A cough helps to clear your throat and lungs. A cough may last only 2-3 weeks (acute), or it may last longer than 8 weeks (chronic). Many different things can cause a cough. A cough may be a sign of an illness or another medical condition. HOME CARE  Pay attention to any changes in your cough.  Take medicines only as told by your doctor.  If you were prescribed an antibiotic medicine, take it as told by your doctor. Do not stop taking it even if you start to feel better.  Talk with your doctor before you try using a cough medicine.  Drink enough fluid to keep your pee (urine) clear or pale yellow.  If the air is dry, use a cold steam vaporizer or humidifier in your home.  Stay away from things that make you cough at work or at home.  If your cough is worse at night, try using extra pillows to raise your head up higher while you sleep.  Do not smoke, and try not to be around smoke. If you need help quitting, ask  your doctor.  Do not have caffeine.  Do not drink alcohol.  Rest as needed. GET HELP IF:  You have new problems (symptoms).  You cough up yellow fluid (pus).  Your cough does not get better after 2-3 weeks, or your cough gets worse.  Medicine does not help your cough and you are not sleeping well.  You have pain that gets worse or pain that is not helped with medicine.  You have a fever.  You are losing weight and you do not know why.  You have night sweats. GET HELP RIGHT AWAY IF:  You cough up blood.  You have  trouble breathing.  Your heartbeat is very fast.   This information is not intended to replace advice given to you by your health care provider. Make sure you discuss any questions you have with your health care provider.   Document Released: 11/14/2010 Document Revised: 11/22/2014 Document Reviewed: 05/10/2014 Elsevier Interactive Patient Education 2016 [employer]  Smoking Cessation, Tips for Success If you are ready to quit smoking, congratulations! You have chosen to help yourself be healthier. Cigarettes bring nicotine, tar, carbon monoxide, and other irritants into your body. Your lungs, heart, and blood vessels will be able to work better without these poisons. There are many different ways to quit smoking. Nicotine gum, nicotine patches, a nicotine inhaler, or nicotine nasal spray can help with physical craving. Hypnosis, support groups, and medicines help break the habit of smoking. WHAT THINGS CAN I DO TO MAKE QUITTING EASIER?  Here are some tips to help you quit for good:  Pick a date when you will quit smoking completely. Tell all of your friends and family about your plan to quit on that date.  Do not try to slowly cut down on the number of cigarettes you are smoking. Pick a quit date and quit smoking completely starting on that day.  Throw away all cigarettes.   Clean and remove all ashtrays from your home, work, and car.  On a card, write down your reasons for quitting. Carry the card with you and read it when you get the urge to smoke.  Cleanse your body of nicotine. Drink enough water and fluids to keep your urine clear or pale yellow. Do this after quitting to flush the nicotine from your body.  Learn to predict your moods. Do not let a bad situation be your excuse to have a cigarette. Some situations in your life might tempt you into wanting a cigarette.  Never have "just one" cigarette. It leads to wanting another and another. Remind yourself of your decision to  quit.  Change habits associated with smoking. If you smoked while driving or when feeling stressed, try other activities to replace smoking. Stand up when drinking your coffee. Brush your teeth after eating. Sit in a different chair when you read the paper. Avoid alcohol while trying to quit, and try to drink fewer caffeinated beverages. Alcohol and caffeine may urge you to smoke.  Avoid foods and drinks that can trigger a desire to smoke, such as sugary or spicy foods and alcohol.  Ask people who smoke not to smoke around you.  Have something planned to do right after eating or having a cup of coffee. For example, plan to take a walk or exercise.  Try a relaxation exercise to calm you down and decrease your stress. Remember, you may be tense and nervous for the first 2 weeks after you quit, but this will pass.  Find  new activities to keep your hands busy. Play with a pen, coin, or rubber band. Doodle or draw things on paper.  Brush your teeth right after eating. This will help cut down on the craving for the taste of tobacco after meals. You can also try mouthwash.   Use oral substitutes in place of cigarettes. Try using lemon drops, carrots, cinnamon sticks, or chewing gum. Keep them handy so they are available when you have the urge to smoke.  When you have the urge to smoke, try deep breathing.  Designate your home as a nonsmoking area.  If you are a heavy smoker, ask your health care provider about a prescription for nicotine chewing gum. It can ease your withdrawal from nicotine.  Reward yourself. Set aside the cigarette money you save and buy yourself something nice.  Look for support from others. Join a support group or smoking cessation program. Ask someone at home or at work to help you with your plan to quit smoking.  Always ask yourself, "Do I need this cigarette or is this just a reflex?" Tell yourself, "Today, I choose not to smoke," or "I do not want to smoke." You are  reminding yourself of your decision to quit.  Do not replace cigarette smoking with electronic cigarettes (commonly called e-cigarettes). The safety of e-cigarettes is unknown, and some may contain harmful chemicals.  If you relapse, do not give up! Plan ahead and think about what you will do the next time you get the urge to smoke. HOW WILL I FEEL WHEN I QUIT SMOKING? You may have symptoms of withdrawal because your body is used to nicotine (the addictive substance in cigarettes). You may crave cigarettes, be irritable, feel very hungry, cough often, get headaches, or have difficulty concentrating. The withdrawal symptoms are only temporary. They are strongest when you first quit but will go away within 10-14 days. When withdrawal symptoms occur, stay in control. Think about your reasons for quitting. Remind yourself that these are signs that your body is healing and getting used to being without cigarettes. Remember that withdrawal symptoms are easier to treat than the major diseases that smoking can cause.  Even after the withdrawal is over, expect periodic urges to smoke. However, these cravings are generally short lived and will go away whether you smoke or not. Do not smoke! WHAT RESOURCES ARE AVAILABLE TO HELP ME QUIT SMOKING? Your health care provider can direct you to community resources or hospitals for support, which may include:  Group support.  Education.  Hypnosis.  Therapy.   This information is not intended to replace advice given to you by your health care provider. Make sure you discuss any questions you have with your health care provider.   Document Released: 11/30/2003 Document Revised: 03/24/2014 Document Reviewed: 08/19/2012 Elsevier Interactive Patient Education [employer] Elsevier Inc.

## 2016-10-21 ENCOUNTER — Emergency Department (HOSPITAL_COMMUNITY)
Admission: EM | Admit: 2016-10-21 | Discharge: 2016-10-21 | Disposition: A | Discharge status: Home / Self Care | Payer: No Typology Code available for payment source | Attending: Emergency Medicine | Admitting: Emergency Medicine

## 2016-10-21 ENCOUNTER — Encounter (HOSPITAL_COMMUNITY): Payer: Self-pay

## 2016-10-21 ENCOUNTER — Emergency Department (HOSPITAL_COMMUNITY): Payer: No Typology Code available for payment source

## 2016-10-21 DIAGNOSIS — M549 Dorsalgia, unspecified: Secondary | ICD-10-CM | POA: Diagnosis present

## 2016-10-21 MED ORDER — ACETAMINOPHEN 500 MG PO TABS: 500.0000 mg | ORAL_TABLET | Freq: Four times a day (QID) | ORAL | 0 refills | Status: DC | PRN

## 2016-10-21 MED ORDER — IBUPROFEN 600 MG PO TABS: 600.0000 mg | ORAL_TABLET | Freq: Four times a day (QID) | ORAL | 0 refills | Status: DC | PRN

## 2016-10-21 MED ORDER — CYCLOBENZAPRINE HCL 10 MG PO TABS: 10.0000 mg | ORAL_TABLET | Freq: Two times a day (BID) | ORAL | 0 refills | Status: DC | PRN
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
## 2017-07-26 ENCOUNTER — Other Ambulatory Visit: Payer: Self-pay

## 2017-07-26 ENCOUNTER — Emergency Department (HOSPITAL_COMMUNITY): Payer: Self-pay

## 2017-07-26 ENCOUNTER — Emergency Department (HOSPITAL_COMMUNITY)
Admission: EM | Admit: 2017-07-26 | Discharge: 2017-07-26 | Disposition: A | Discharge status: Home / Self Care | Payer: Self-pay | Attending: Emergency Medicine | Admitting: Emergency Medicine

## 2017-07-26 ENCOUNTER — Encounter (HOSPITAL_COMMUNITY): Payer: Self-pay

## 2017-07-26 DIAGNOSIS — S21212A Laceration without foreign body of left back wall of thorax without penetration into thoracic cavity, initial encounter: Secondary | ICD-10-CM | POA: Insufficient documentation

## 2017-07-26 DIAGNOSIS — S21112A Laceration without foreign body of left front wall of thorax without penetration into thoracic cavity, initial encounter: Secondary | ICD-10-CM | POA: Insufficient documentation

## 2017-07-26 DIAGNOSIS — F17210 Nicotine dependence, cigarettes, uncomplicated: Secondary | ICD-10-CM | POA: Insufficient documentation

## 2017-07-26 DIAGNOSIS — Y999 Unspecified external cause status: Secondary | ICD-10-CM | POA: Insufficient documentation

## 2017-07-26 DIAGNOSIS — R600 Localized edema: Secondary | ICD-10-CM | POA: Insufficient documentation

## 2017-07-26 DIAGNOSIS — S61011A Laceration without foreign body of right thumb without damage to nail, initial encounter: Secondary | ICD-10-CM | POA: Insufficient documentation

## 2017-07-26 DIAGNOSIS — Y939 Activity, unspecified: Secondary | ICD-10-CM | POA: Insufficient documentation

## 2017-07-26 DIAGNOSIS — Y929 Unspecified place or not applicable: Secondary | ICD-10-CM | POA: Insufficient documentation

## 2017-07-26 DIAGNOSIS — T07XXXA Unspecified multiple injuries, initial encounter: Secondary | ICD-10-CM

## 2017-07-26 DIAGNOSIS — S71111A Laceration without foreign body, right thigh, initial encounter: Secondary | ICD-10-CM | POA: Insufficient documentation

## 2017-07-26 DIAGNOSIS — Z23 Encounter for immunization: Secondary | ICD-10-CM | POA: Insufficient documentation

## 2017-07-26 DIAGNOSIS — S81812A Laceration without foreign body, left lower leg, initial encounter: Secondary | ICD-10-CM | POA: Insufficient documentation

## 2017-07-26 DIAGNOSIS — T148XXA Other injury of unspecified body region, initial encounter: Secondary | ICD-10-CM

## 2017-07-26 DIAGNOSIS — S80211A Abrasion, right knee, initial encounter: Secondary | ICD-10-CM | POA: Insufficient documentation

## 2017-07-26 HISTORY — DX: Bipolar disorder, unspecified: F31.9

## 2017-07-26 LAB — CBC
HEMATOCRIT: 39.8 % (ref 39.0–52.0)
Hemoglobin: 13.6 g/dL (ref 13.0–17.0)
MCH: 30.2 pg (ref 26.0–34.0)
MCHC: 34.2 g/dL (ref 30.0–36.0)
MCV: 88.2 fL (ref 78.0–100.0)
Platelets: 255 10*3/uL (ref 150–400)
RBC: 4.51 MIL/uL (ref 4.22–5.81)
RDW: 13.5 % (ref 11.5–15.5)
WBC: 9.8 10*3/uL (ref 4.0–10.5)

## 2017-07-26 LAB — TYPE AND SCREEN
ABO/RH(D): B POS
Antibody Screen: NEGATIVE
UNIT DIVISION: 0
UNIT DIVISION: 0

## 2017-07-26 LAB — PREPARE FRESH FROZEN PLASMA
UNIT DIVISION: 0
Unit division: 0

## 2017-07-26 LAB — PROTIME-INR
INR: 1.06
PROTHROMBIN TIME: 13.7 s (ref 11.4–15.2)

## 2017-07-26 LAB — I-STAT CG4 LACTIC ACID, ED: Lactic Acid, Venous: 2.71 mmol/L (ref 0.5–1.9)

## 2017-07-26 LAB — URINALYSIS, ROUTINE W REFLEX MICROSCOPIC
Bacteria, UA: NONE SEEN
Bilirubin Urine: NEGATIVE
GLUCOSE, UA: NEGATIVE mg/dL
Ketones, ur: NEGATIVE mg/dL
Leukocytes, UA: NEGATIVE
Nitrite: NEGATIVE
PH: 6 (ref 5.0–8.0)
PROTEIN: NEGATIVE mg/dL
SPECIFIC GRAVITY, URINE: 1.012 (ref 1.005–1.030)

## 2017-07-26 LAB — COMPREHENSIVE METABOLIC PANEL
ALBUMIN: 3.6 g/dL (ref 3.5–5.0)
ALT: 13 U/L — AB (ref 17–63)
AST: 21 U/L (ref 15–41)
Alkaline Phosphatase: 49 U/L (ref 38–126)
Anion gap: 7 (ref 5–15)
BUN: 8 mg/dL (ref 6–20)
CHLORIDE: 106 mmol/L (ref 101–111)
CO2: 27 mmol/L (ref 22–32)
Calcium: 8.7 mg/dL — ABNORMAL LOW (ref 8.9–10.3)
Creatinine, Ser: 1.26 mg/dL — ABNORMAL HIGH (ref 0.61–1.24)
GFR calc Af Amer: >60 mL/min (ref >60)
GFR calc non Af Amer: >60 mL/min (ref >60)
Glucose, Bld: 112 mg/dL — ABNORMAL HIGH (ref 65–99)
POTASSIUM: 3.6 mmol/L (ref 3.5–5.1)
Sodium: 140 mmol/L (ref 135–145)
Total Bilirubin: 0.6 mg/dL (ref 0.3–1.2)
Total Protein: 6 g/dL — ABNORMAL LOW (ref 6.5–8.1)

## 2017-07-26 LAB — BPAM FFP
BLOOD PRODUCT EXPIRATION DATE: 201905252359
Blood Product Expiration Date: 201905252359
ISSUE DATE / TIME: 201905121134
ISSUE DATE / TIME: 201905121134
UNIT TYPE AND RH: 6200
UNIT TYPE AND RH: 6200

## 2017-07-26 LAB — BPAM RBC
BLOOD PRODUCT EXPIRATION DATE: 201905142359
Blood Product Expiration Date: 201905302359
ISSUE DATE / TIME: 201905121133
ISSUE DATE / TIME: 201905121133
UNIT TYPE AND RH: 9500
Unit Type and Rh: 9500

## 2017-07-26 LAB — ABO/RH: ABO/RH(D): B POS

## 2017-07-26 LAB — I-STAT CHEM 8, ED
BUN: 9 mg/dL (ref 6–20)
Calcium, Ion: 1.11 mmol/L — ABNORMAL LOW (ref 1.15–1.40)
Chloride: 102 mmol/L (ref 101–111)
Creatinine, Ser: 1.1 mg/dL (ref 0.61–1.24)
Glucose, Bld: 103 mg/dL — ABNORMAL HIGH (ref 65–99)
HEMATOCRIT: 41 % (ref 39.0–52.0)
Hemoglobin: 13.9 g/dL (ref 13.0–17.0)
POTASSIUM: 3.6 mmol/L (ref 3.5–5.1)
SODIUM: 141 mmol/L (ref 135–145)
TCO2: 26 mmol/L (ref 22–32)

## 2017-07-26 LAB — ETHANOL: Alcohol, Ethyl (B): <10 mg/dL (ref <10)

## 2017-07-26 LAB — CDS SEROLOGY

## 2017-07-26 MED ORDER — LIDOCAINE-EPINEPHRINE 1 %-1:100000 IJ SOLN
20.0000 mL | Freq: Once | INTRAMUSCULAR | Status: AC
  Administered 2017-07-26: 20 mL via INTRADERMAL
  Filled 2017-07-26: qty 20

## 2017-07-26 MED ORDER — BACITRACIN-NEOMYCIN-POLYMYXIN 400-5-5000 EX OINT: 1.0000 "application " | TOPICAL_OINTMENT | Freq: Two times a day (BID) | CUTANEOUS | 0 refills | Status: DC

## 2017-07-26 MED ORDER — OXYCODONE-ACETAMINOPHEN 5-325 MG PO TABS: 1.0000 | ORAL_TABLET | Freq: Four times a day (QID) | ORAL | 0 refills | Status: DC | PRN

## 2017-07-26 MED ORDER — HYDROMORPHONE HCL 2 MG/ML IJ SOLN
1.0000 mg | Freq: Once | INTRAMUSCULAR | Status: AC
  Administered 2017-07-26: 1 mg via INTRAVENOUS

## 2017-07-26 MED ORDER — LACTATED RINGERS IV BOLUS
2000.0000 mL | Freq: Once | INTRAVENOUS | Status: AC
  Administered 2017-07-26: 2000 mL via INTRAVENOUS

## 2017-07-26 MED ORDER — HYDROMORPHONE HCL 2 MG/ML IJ SOLN
INTRAMUSCULAR | Status: AC
  Filled 2017-07-26: qty 1

## 2017-07-26 MED ORDER — TETANUS-DIPHTH-ACELL PERTUSSIS 5-2.5-18.5 LF-MCG/0.5 IM SUSP
0.5000 mL | Freq: Once | INTRAMUSCULAR | Status: AC
  Administered 2017-07-26: 0.5 mL via INTRAMUSCULAR
  Filled 2017-07-26: qty 0.5

## 2017-07-26 MED ORDER — TETANUS-DIPHTHERIA TOXOIDS TD 5-2 LFU IM INJ: 0.5000 mL | INJECTION | Freq: Once | INTRAMUSCULAR | Status: DC
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
## 2017-07-27 ENCOUNTER — Emergency Department (HOSPITAL_COMMUNITY)
Admission: EM | Admit: 2017-07-27 | Discharge: 2017-07-28 | Disposition: A | Discharge status: Home / Self Care | Payer: Self-pay | Attending: Emergency Medicine | Admitting: Emergency Medicine

## 2017-07-27 ENCOUNTER — Emergency Department (HOSPITAL_COMMUNITY): Payer: Self-pay

## 2017-07-27 ENCOUNTER — Encounter (HOSPITAL_COMMUNITY): Payer: Self-pay | Admitting: Emergency Medicine

## 2017-07-27 ENCOUNTER — Other Ambulatory Visit: Payer: Self-pay

## 2017-07-27 DIAGNOSIS — R55 Syncope and collapse: Secondary | ICD-10-CM | POA: Insufficient documentation

## 2017-07-27 DIAGNOSIS — E876 Hypokalemia: Secondary | ICD-10-CM | POA: Insufficient documentation

## 2017-07-27 DIAGNOSIS — Z4800 Encounter for change or removal of nonsurgical wound dressing: Secondary | ICD-10-CM | POA: Insufficient documentation

## 2017-07-27 DIAGNOSIS — F17210 Nicotine dependence, cigarettes, uncomplicated: Secondary | ICD-10-CM | POA: Insufficient documentation

## 2017-07-27 DIAGNOSIS — R1012 Left upper quadrant pain: Secondary | ICD-10-CM | POA: Insufficient documentation

## 2017-07-27 LAB — CBG MONITORING, ED: GLUCOSE-CAPILLARY: 84 mg/dL (ref 65–99)

## 2017-07-27 LAB — CBC
HCT: 36.5 % — ABNORMAL LOW (ref 39.0–52.0)
HEMOGLOBIN: 12.2 g/dL — AB (ref 13.0–17.0)
MCH: 29.6 pg (ref 26.0–34.0)
MCHC: 33.4 g/dL (ref 30.0–36.0)
MCV: 88.6 fL (ref 78.0–100.0)
Platelets: 227 10*3/uL (ref 150–400)
RBC: 4.12 MIL/uL — AB (ref 4.22–5.81)
RDW: 13.4 % (ref 11.5–15.5)
WBC: 11.4 10*3/uL — AB (ref 4.0–10.5)

## 2017-07-27 LAB — BASIC METABOLIC PANEL
ANION GAP: 6 (ref 5–15)
BUN: 14 mg/dL (ref 6–20)
CALCIUM: 8.3 mg/dL — AB (ref 8.9–10.3)
CO2: 28 mmol/L (ref 22–32)
CREATININE: 1.38 mg/dL — AB (ref 0.61–1.24)
Chloride: 105 mmol/L (ref 101–111)
GFR calc non Af Amer: >60 mL/min (ref >60)
Glucose, Bld: 100 mg/dL — ABNORMAL HIGH (ref 65–99)
Potassium: 3.4 mmol/L — ABNORMAL LOW (ref 3.5–5.1)
SODIUM: 139 mmol/L (ref 135–145)

## 2017-07-27 MED ORDER — POTASSIUM CHLORIDE CRYS ER 20 MEQ PO TBCR
40.0000 meq | EXTENDED_RELEASE_TABLET | Freq: Once | ORAL | Status: AC
  Administered 2017-07-27: 40 meq via ORAL
  Filled 2017-07-27: qty 2

## 2017-07-27 MED ORDER — IOPAMIDOL (ISOVUE-300) INJECTION 61%
100.0000 mL | Freq: Once | INTRAVENOUS | Status: AC | PRN
  Administered 2017-07-27: 100 mL via INTRAVENOUS

## 2017-07-27 MED ORDER — MORPHINE SULFATE (PF) 4 MG/ML IV SOLN
4.0000 mg | Freq: Once | INTRAVENOUS | Status: AC
  Administered 2017-07-27: 4 mg via INTRAVENOUS
  Filled 2017-07-27: qty 1

## 2017-07-27 MED ORDER — SODIUM CHLORIDE 0.9 % IV BOLUS
2000.0000 mL | Freq: Once | INTRAVENOUS | Status: AC
  Administered 2017-07-27: 2000 mL via INTRAVENOUS

## 2017-07-27 NOTE — ED Notes (Signed)
Patient transported to X-ray 

## 2017-07-27 NOTE — ED Notes (Signed)
ED Provider at bedside. 
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
## 2017-07-28 MED ORDER — OXYCODONE-ACETAMINOPHEN 5-325 MG PO TABS
2.0000 | ORAL_TABLET | Freq: Once | ORAL | Status: AC
  Administered 2017-07-28: 2 via ORAL
  Filled 2017-07-28: qty 2

## 2017-07-28 NOTE — Discharge Instructions (Signed)
We feel that you were dehydrated which contributed to fainting episode.Make sure that you drink at least six 8 ounce glasses of water or Gatorade each day in order to stay well-hydrated.  Staples and stitches to come out in 8 days.  Look for signs of infection to include fever, drainage from the wound, redness around the wounds.  Return if you feel worse for any reason.  Use crutches as needed.  You can get stitches and staples taken out an urgent care center.  If you do not have a primary care physician, you can call the [facility] to get assigned a primary care physician.

## 2017-07-29 ENCOUNTER — Encounter (HOSPITAL_COMMUNITY): Payer: Self-pay

## 2017-08-08 ENCOUNTER — Encounter (HOSPITAL_COMMUNITY): Payer: Self-pay | Admitting: Emergency Medicine

## 2017-08-08 ENCOUNTER — Other Ambulatory Visit: Payer: Self-pay

## 2017-08-08 ENCOUNTER — Emergency Department (HOSPITAL_COMMUNITY)
Admission: EM | Admit: 2017-08-08 | Discharge: 2017-08-08 | Disposition: A | Discharge status: Home / Self Care | Payer: Self-pay | Attending: Emergency Medicine | Admitting: Emergency Medicine

## 2017-08-08 DIAGNOSIS — F17210 Nicotine dependence, cigarettes, uncomplicated: Secondary | ICD-10-CM | POA: Insufficient documentation

## 2017-08-08 DIAGNOSIS — S81812D Laceration without foreign body, left lower leg, subsequent encounter: Secondary | ICD-10-CM | POA: Insufficient documentation

## 2017-08-08 DIAGNOSIS — Z79899 Other long term (current) drug therapy: Secondary | ICD-10-CM | POA: Insufficient documentation

## 2017-08-08 DIAGNOSIS — Z4802 Encounter for removal of sutures: Secondary | ICD-10-CM | POA: Insufficient documentation
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
## 2017-08-08 NOTE — Discharge Instructions (Signed)
Keep the areas clean with mild soap and water.  You may keep them bandaged.  Follow-up with your primary doctor for recheck.

## 2017-08-08 NOTE — ED Triage Notes (Signed)
Pt states he has 6 staples to his left leg that needs removal, have been in about 1-2 weeks

## 2018-05-16 ENCOUNTER — Emergency Department (HOSPITAL_COMMUNITY)
Admission: EM | Admit: 2018-05-16 | Discharge: 2018-05-16 | Disposition: A | Discharge status: Home / Self Care | Payer: Self-pay | Attending: Emergency Medicine | Admitting: Emergency Medicine

## 2018-05-16 ENCOUNTER — Other Ambulatory Visit: Payer: Self-pay

## 2018-05-16 DIAGNOSIS — Z789 Other specified health status: Secondary | ICD-10-CM

## 2018-05-16 DIAGNOSIS — Z720 Tobacco use: Secondary | ICD-10-CM

## 2018-05-16 DIAGNOSIS — Z008 Encounter for other general examination: Secondary | ICD-10-CM

## 2018-05-16 DIAGNOSIS — F1910 Other psychoactive substance abuse, uncomplicated: Secondary | ICD-10-CM | POA: Insufficient documentation

## 2018-05-16 DIAGNOSIS — F1010 Alcohol abuse, uncomplicated: Secondary | ICD-10-CM | POA: Insufficient documentation

## 2018-05-16 DIAGNOSIS — Z7289 Other problems related to lifestyle: Secondary | ICD-10-CM

## 2018-05-16 DIAGNOSIS — F17210 Nicotine dependence, cigarettes, uncomplicated: Secondary | ICD-10-CM | POA: Insufficient documentation

## 2018-05-16 LAB — CBC WITH DIFFERENTIAL/PLATELET
ABS IMMATURE GRANULOCYTES: 0.01 10*3/uL (ref 0.00–0.07)
Basophils Absolute: 0 10*3/uL (ref 0.0–0.1)
Basophils Relative: 1 %
Eosinophils Absolute: 0.2 10*3/uL (ref 0.0–0.5)
Eosinophils Relative: 3 %
HCT: 43.8 % (ref 39.0–52.0)
HEMOGLOBIN: 14.7 g/dL (ref 13.0–17.0)
Immature Granulocytes: 0 %
LYMPHS PCT: 29 %
Lymphs Abs: 2.1 10*3/uL (ref 0.7–4.0)
MCH: 30.2 pg (ref 26.0–34.0)
MCHC: 33.6 g/dL (ref 30.0–36.0)
MCV: 90.1 fL (ref 80.0–100.0)
MONO ABS: 0.7 10*3/uL (ref 0.1–1.0)
Monocytes Relative: 10 %
NEUTROS ABS: 4.3 10*3/uL (ref 1.7–7.7)
Neutrophils Relative %: 57 %
Platelets: 304 10*3/uL (ref 150–400)
RBC: 4.86 MIL/uL (ref 4.22–5.81)
RDW: 13.2 % (ref 11.5–15.5)
WBC: 7.4 10*3/uL (ref 4.0–10.5)
nRBC: 0 % (ref 0.0–0.2)

## 2018-05-16 LAB — COMPREHENSIVE METABOLIC PANEL
ALBUMIN: 3.4 g/dL — AB (ref 3.5–5.0)
ALK PHOS: 51 U/L (ref 38–126)
ALT: 16 U/L (ref 0–44)
AST: 19 U/L (ref 15–41)
Anion gap: 7 (ref 5–15)
BILIRUBIN TOTAL: 0.4 mg/dL (ref 0.3–1.2)
BUN: 13 mg/dL (ref 6–20)
CALCIUM: 8.4 mg/dL — AB (ref 8.9–10.3)
CO2: 23 mmol/L (ref 22–32)
Chloride: 110 mmol/L (ref 98–111)
Creatinine, Ser: 1.33 mg/dL — ABNORMAL HIGH (ref 0.61–1.24)
GFR calc Af Amer: >60 mL/min (ref >60)
GFR calc non Af Amer: >60 mL/min (ref >60)
GLUCOSE: 93 mg/dL (ref 70–99)
Potassium: 3.4 mmol/L — ABNORMAL LOW (ref 3.5–5.1)
Sodium: 140 mmol/L (ref 135–145)
Total Protein: 5.5 g/dL — ABNORMAL LOW (ref 6.5–8.1)

## 2018-05-16 LAB — RAPID URINE DRUG SCREEN, HOSP PERFORMED
AMPHETAMINES: NOT DETECTED
BENZODIAZEPINES: NOT DETECTED
Barbiturates: NOT DETECTED
Cocaine: POSITIVE — AB
Opiates: NOT DETECTED
TETRAHYDROCANNABINOL: POSITIVE — AB

## 2018-05-16 LAB — ETHANOL: Alcohol, Ethyl (B): 37 mg/dL — ABNORMAL HIGH (ref <10)

## 2018-05-16 LAB — SALICYLATE LEVEL: Salicylate Lvl: <7 mg/dL (ref 2.8–30.0)

## 2018-05-16 LAB — ACETAMINOPHEN LEVEL: Acetaminophen (Tylenol), Serum: <10 ug/mL — ABNORMAL LOW (ref 10–30)

## 2018-05-16 MED ORDER — POTASSIUM CHLORIDE CRYS ER 20 MEQ PO TBCR
20.0000 meq | EXTENDED_RELEASE_TABLET | Freq: Once | ORAL | Status: AC
  Administered 2018-05-16: 20 meq via ORAL
  Filled 2018-05-16: qty 1

## 2018-05-16 NOTE — ED Notes (Signed)
Patient verbalizes understanding of discharge instructions. Opportunity for questioning and answers were provided. Armband removed by staff, pt discharged from ED.  
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
## 2018-05-16 NOTE — Discharge Instructions (Signed)
You have been medically cleared. Go to the rehab you were planning on going to as soon as you leave here. Stop using drugs, drinking alcohol, and smoking. Follow up with the rehab for ongoing management of your drug/alcohol problems. Return to the ER for emergent changes or worsening symptoms.

## 2018-09-02 ENCOUNTER — Emergency Department (HOSPITAL_COMMUNITY): Payer: Self-pay

## 2018-09-02 ENCOUNTER — Encounter (HOSPITAL_COMMUNITY): Payer: Self-pay | Admitting: Emergency Medicine

## 2018-09-02 ENCOUNTER — Other Ambulatory Visit: Payer: Self-pay

## 2018-09-02 ENCOUNTER — Emergency Department (HOSPITAL_COMMUNITY)
Admission: EM | Admit: 2018-09-02 | Discharge: 2018-09-02 | Disposition: A | Discharge status: Home / Self Care | Payer: Self-pay | Attending: Emergency Medicine | Admitting: Emergency Medicine

## 2018-09-02 DIAGNOSIS — F17210 Nicotine dependence, cigarettes, uncomplicated: Secondary | ICD-10-CM | POA: Insufficient documentation

## 2018-09-02 DIAGNOSIS — B349 Viral infection, unspecified: Secondary | ICD-10-CM | POA: Insufficient documentation

## 2018-09-02 DIAGNOSIS — R070 Pain in throat: Secondary | ICD-10-CM | POA: Insufficient documentation

## 2018-09-02 DIAGNOSIS — R110 Nausea: Secondary | ICD-10-CM | POA: Insufficient documentation

## 2018-09-02 DIAGNOSIS — Z20828 Contact with and (suspected) exposure to other viral communicable diseases: Secondary | ICD-10-CM | POA: Insufficient documentation

## 2018-09-02 DIAGNOSIS — R509 Fever, unspecified: Secondary | ICD-10-CM | POA: Insufficient documentation

## 2018-09-02 DIAGNOSIS — R51 Headache: Secondary | ICD-10-CM | POA: Insufficient documentation

## 2018-09-02 DIAGNOSIS — M549 Dorsalgia, unspecified: Secondary | ICD-10-CM | POA: Insufficient documentation

## 2018-09-02 LAB — GROUP A STREP BY PCR: Group A Strep by PCR: NOT DETECTED

## 2018-09-02 MED ORDER — ACETAMINOPHEN 325 MG PO TABS
325.0000 mg | ORAL_TABLET | Freq: Once | ORAL | Status: AC
  Administered 2018-09-02: 325 mg via ORAL
  Filled 2018-09-02: qty 1

## 2018-09-02 MED ORDER — IBUPROFEN 800 MG PO TABS
800.0000 mg | ORAL_TABLET | Freq: Once | ORAL | Status: AC
  Administered 2018-09-02: 14:00:00 800 mg via ORAL
  Filled 2018-09-02: qty 1

## 2018-09-02 MED ORDER — ONDANSETRON HCL 4 MG PO TABS: 4.0000 mg | ORAL_TABLET | Freq: Four times a day (QID) | ORAL | 0 refills | Status: DC

## 2018-09-02 NOTE — ED Notes (Signed)
Patient verbalizes understanding of discharge instructions. Opportunity for questioning and answers were provided. Armband removed by staff, pt discharged from ED.  

## 2018-09-02 NOTE — Discharge Instructions (Signed)
Tylenol/Ibuprofen as needed for pain/fever.   We will call if your covid test is positive.  You must return to ED sooner if you cannot breathe.
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
## 2018-09-02 NOTE — ED Triage Notes (Signed)
Pt arrives to ED from home with complaints of generalized body aches and sore throat for the last two days.

## 2018-09-03 LAB — NOVEL CORONAVIRUS, NAA (HOSP ORDER, SEND-OUT TO REF LAB; TAT 18-24 HRS): SARS-CoV-2, NAA: NOT DETECTED

## 2018-11-16 ENCOUNTER — Emergency Department (HOSPITAL_COMMUNITY)
Admission: EM | Admit: 2018-11-16 | Discharge: 2018-11-16 | Disposition: A | Discharge status: Home / Self Care | Payer: Self-pay | Attending: Emergency Medicine | Admitting: Emergency Medicine

## 2018-11-16 ENCOUNTER — Emergency Department (HOSPITAL_COMMUNITY): Payer: Self-pay

## 2018-11-16 DIAGNOSIS — F17210 Nicotine dependence, cigarettes, uncomplicated: Secondary | ICD-10-CM | POA: Insufficient documentation

## 2018-11-16 DIAGNOSIS — R0789 Other chest pain: Secondary | ICD-10-CM | POA: Insufficient documentation

## 2018-11-16 DIAGNOSIS — Z79899 Other long term (current) drug therapy: Secondary | ICD-10-CM | POA: Insufficient documentation

## 2018-11-16 DIAGNOSIS — R112 Nausea with vomiting, unspecified: Secondary | ICD-10-CM | POA: Insufficient documentation

## 2018-11-16 DIAGNOSIS — R109 Unspecified abdominal pain: Secondary | ICD-10-CM | POA: Insufficient documentation

## 2018-11-16 DIAGNOSIS — F1410 Cocaine abuse, uncomplicated: Secondary | ICD-10-CM | POA: Insufficient documentation

## 2018-11-16 DIAGNOSIS — F1210 Cannabis abuse, uncomplicated: Secondary | ICD-10-CM | POA: Insufficient documentation

## 2018-11-16 LAB — CBC
HCT: 39.6 % (ref 39.0–52.0)
Hemoglobin: 13.1 g/dL (ref 13.0–17.0)
MCH: 31 pg (ref 26.0–34.0)
MCHC: 33.1 g/dL (ref 30.0–36.0)
MCV: 93.8 fL (ref 80.0–100.0)
Platelets: 272 10*3/uL (ref 150–400)
RBC: 4.22 MIL/uL (ref 4.22–5.81)
RDW: 14.1 % (ref 11.5–15.5)
WBC: 20.9 10*3/uL — ABNORMAL HIGH (ref 4.0–10.5)
nRBC: 0 % (ref 0.0–0.2)

## 2018-11-16 LAB — URINALYSIS, ROUTINE W REFLEX MICROSCOPIC
Bacteria, UA: NONE SEEN
Bilirubin Urine: NEGATIVE
Glucose, UA: NEGATIVE mg/dL
Hgb urine dipstick: NEGATIVE
Ketones, ur: 20 mg/dL — AB
Leukocytes,Ua: NEGATIVE
Nitrite: NEGATIVE
Protein, ur: 30 mg/dL — AB
Specific Gravity, Urine: 1.029 (ref 1.005–1.030)
pH: 6 (ref 5.0–8.0)

## 2018-11-16 LAB — BASIC METABOLIC PANEL
Anion gap: 9 (ref 5–15)
BUN: 15 mg/dL (ref 6–20)
CO2: 26 mmol/L (ref 22–32)
Calcium: 8.5 mg/dL — ABNORMAL LOW (ref 8.9–10.3)
Chloride: 106 mmol/L (ref 98–111)
Creatinine, Ser: 1.26 mg/dL — ABNORMAL HIGH (ref 0.61–1.24)
GFR calc Af Amer: >60 mL/min (ref >60)
GFR calc non Af Amer: >60 mL/min (ref >60)
Glucose, Bld: 88 mg/dL (ref 70–99)
Potassium: 3.6 mmol/L (ref 3.5–5.1)
Sodium: 141 mmol/L (ref 135–145)

## 2018-11-16 LAB — HEPATIC FUNCTION PANEL
ALT: 22 U/L (ref 0–44)
AST: 26 U/L (ref 15–41)
Albumin: 3.6 g/dL (ref 3.5–5.0)
Alkaline Phosphatase: 46 U/L (ref 38–126)
Bilirubin, Direct: 0.2 mg/dL (ref 0.0–0.2)
Indirect Bilirubin: 0.6 mg/dL (ref 0.3–0.9)
Total Bilirubin: 0.8 mg/dL (ref 0.3–1.2)
Total Protein: 6.3 g/dL — ABNORMAL LOW (ref 6.5–8.1)

## 2018-11-16 LAB — RAPID URINE DRUG SCREEN, HOSP PERFORMED
Amphetamines: NOT DETECTED
Barbiturates: NOT DETECTED
Benzodiazepines: NOT DETECTED
Cocaine: POSITIVE — AB
Opiates: NOT DETECTED
Tetrahydrocannabinol: POSITIVE — AB

## 2018-11-16 LAB — D-DIMER, QUANTITATIVE: D-Dimer, Quant: <0.27 ug/mL-FEU (ref 0.00–0.50)

## 2018-11-16 LAB — TROPONIN I (HIGH SENSITIVITY)
Troponin I (High Sensitivity): 12 ng/L (ref <18)
Troponin I (High Sensitivity): 12 ng/L (ref <18)

## 2018-11-16 LAB — LIPASE, BLOOD: Lipase: 17 U/L (ref 11–51)

## 2018-11-16 MED ORDER — SODIUM CHLORIDE 0.9 % IV BOLUS (SEPSIS)
1000.0000 mL | Freq: Once | INTRAVENOUS | Status: AC
  Administered 2018-11-16: 14:00:00 1000 mL via INTRAVENOUS

## 2018-11-16 MED ORDER — ALUM & MAG HYDROXIDE-SIMETH 200-200-20 MG/5ML PO SUSP
30.0000 mL | Freq: Once | ORAL | Status: AC
  Administered 2018-11-16: 14:00:00 30 mL via ORAL
  Filled 2018-11-16: qty 30

## 2018-11-16 MED ORDER — ONDANSETRON HCL 4 MG/2ML IJ SOLN
4.0000 mg | Freq: Once | INTRAMUSCULAR | Status: AC
  Administered 2018-11-16: 14:00:00 4 mg via INTRAVENOUS
  Filled 2018-11-16: qty 2

## 2018-11-16 MED ORDER — LORAZEPAM 2 MG/ML IJ SOLN
1.0000 mg | Freq: Once | INTRAMUSCULAR | Status: AC
  Administered 2018-11-16: 1 mg via INTRAVENOUS
  Filled 2018-11-16: qty 1

## 2018-11-16 MED ORDER — LIDOCAINE VISCOUS HCL 2 % MT SOLN
15.0000 mL | Freq: Once | OROMUCOSAL | Status: AC
  Administered 2018-11-16: 15 mL via ORAL
  Filled 2018-11-16: qty 15

## 2018-11-16 NOTE — ED Notes (Signed)
Urinal provided . Pt encouraged to provide specimen per MD order.

## 2018-11-16 NOTE — ED Notes (Signed)
Pt d/c home per MD order. Discharge summary reviewed, pt verbalizes understanding. Voicng no complaints at discharge, reports he is taking the bus home.

## 2018-11-16 NOTE — ED Triage Notes (Signed)
Pt to ED via EMS from home, c/o chest pain hat radiates to back, reports pain started last night. Reports syncopal episode last night as well. #20LAC, Aspirin and nitro given by EMS. 1200 NS bolus given. 12 lead showing NSR for EMS.
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
## 2018-11-16 NOTE — ED Provider Notes (Signed)
43yo male with complaint of abdominal pain which became chest pain onset yesterday. Awaiting LFTs and 2nd troponin. If LFTs elevated consider abdominal imaging.  Physical Exam  BP (!) 94/56   Pulse 73   Temp 98.4 F (36.9 C) (Oral)   Resp 20   Ht 5\' 11"  (1.803 m)   Wt 88.9 kg   SpO2 98%   BMI 27.34 kg/m   Physical Exam  ED Course/Procedures   Clinical Course as of Nov 16 1506  Tue Nov 15, 5037  3939 43 year old male presenting with chest pain since yesterday. Presented very anxious with sensation in his chest and back. Also abd pain, n/v. Patient cocaine and THC positive. Likely this is the cause of his symptoms. He is now resting with no symptoms after GI coctail and ativan and fluids. Pending LFTs. Patient became irritated when speaking with him about drug use. He may also have a component of GERD given his daily drinking. May go home if remaining labs normal (LFT and repeat trop). Heart score 2.  Patient handed off to Suella Broad PA-C due to end of shift  HCT: 39.6 [KM]    Clinical Course User Index [KM] Alveria Apley, PA-C    Procedures  MDM  LFTs are reassuring, repeat troponin.  Reviewed results and plan of care with patient who patient is feeling better at this time, abdomen is soft and nontender.  Recommend patient follow-up with [place] [facility].      Tacy Learn, PA-C 11/16/18 1647    Valarie Merino, MD 11/19/18 [phone]

## 2019-10-30 ENCOUNTER — Other Ambulatory Visit: Payer: Self-pay

## 2019-10-30 ENCOUNTER — Encounter (HOSPITAL_COMMUNITY): Payer: Self-pay

## 2019-10-30 ENCOUNTER — Emergency Department (HOSPITAL_COMMUNITY): Payer: No Typology Code available for payment source

## 2019-10-30 ENCOUNTER — Emergency Department (HOSPITAL_COMMUNITY)
Admission: EM | Admit: 2019-10-30 | Discharge: 2019-10-30 | Disposition: A | Discharge status: Home / Self Care | Payer: No Typology Code available for payment source | Attending: Emergency Medicine | Admitting: Emergency Medicine

## 2019-10-30 DIAGNOSIS — Z041 Encounter for examination and observation following transport accident: Secondary | ICD-10-CM | POA: Diagnosis not present

## 2019-10-30 DIAGNOSIS — S0181XA Laceration without foreign body of other part of head, initial encounter: Secondary | ICD-10-CM | POA: Insufficient documentation

## 2019-10-30 DIAGNOSIS — Y999 Unspecified external cause status: Secondary | ICD-10-CM | POA: Insufficient documentation

## 2019-10-30 DIAGNOSIS — F17210 Nicotine dependence, cigarettes, uncomplicated: Secondary | ICD-10-CM | POA: Diagnosis not present

## 2019-10-30 DIAGNOSIS — Y92410 Unspecified street and highway as the place of occurrence of the external cause: Secondary | ICD-10-CM | POA: Insufficient documentation

## 2019-10-30 DIAGNOSIS — S161XXA Strain of muscle, fascia and tendon at neck level, initial encounter: Secondary | ICD-10-CM | POA: Diagnosis not present

## 2019-10-30 DIAGNOSIS — Z23 Encounter for immunization: Secondary | ICD-10-CM | POA: Diagnosis not present

## 2019-10-30 DIAGNOSIS — M791 Myalgia, unspecified site: Secondary | ICD-10-CM | POA: Insufficient documentation

## 2019-10-30 DIAGNOSIS — S0990XA Unspecified injury of head, initial encounter: Secondary | ICD-10-CM | POA: Diagnosis not present

## 2019-10-30 DIAGNOSIS — M25512 Pain in left shoulder: Secondary | ICD-10-CM | POA: Diagnosis not present

## 2019-10-30 DIAGNOSIS — Y9389 Activity, other specified: Secondary | ICD-10-CM | POA: Diagnosis not present

## 2019-10-30 LAB — CBC
HCT: 46 % (ref 39.0–52.0)
Hemoglobin: 15 g/dL (ref 13.0–17.0)
MCH: 30.4 pg (ref 26.0–34.0)
MCHC: 32.6 g/dL (ref 30.0–36.0)
MCV: 93.3 fL (ref 80.0–100.0)
Platelets: 277 10*3/uL (ref 150–400)
RBC: 4.93 MIL/uL (ref 4.22–5.81)
RDW: 13.9 % (ref 11.5–15.5)
WBC: 10.4 10*3/uL (ref 4.0–10.5)
nRBC: 0 % (ref 0.0–0.2)

## 2019-10-30 LAB — BASIC METABOLIC PANEL
Anion gap: 7 (ref 5–15)
BUN: 15 mg/dL (ref 6–20)
CO2: 28 mmol/L (ref 22–32)
Calcium: 8.8 mg/dL — ABNORMAL LOW (ref 8.9–10.3)
Chloride: 107 mmol/L (ref 98–111)
Creatinine, Ser: 1.31 mg/dL — ABNORMAL HIGH (ref 0.61–1.24)
GFR calc Af Amer: >60 mL/min (ref >60)
GFR calc non Af Amer: >60 mL/min (ref >60)
Glucose, Bld: 93 mg/dL (ref 70–99)
Potassium: 3.9 mmol/L (ref 3.5–5.1)
Sodium: 142 mmol/L (ref 135–145)

## 2019-10-30 LAB — TROPONIN I (HIGH SENSITIVITY)
Troponin I (High Sensitivity): 20 ng/L — ABNORMAL HIGH (ref <18)
Troponin I (High Sensitivity): 13 ng/L (ref <18)

## 2019-10-30 MED ORDER — NAPROXEN 500 MG PO TABS
500.0000 mg | ORAL_TABLET | Freq: Two times a day (BID) | ORAL | 0 refills | Status: DC
Start: 2019-10-30 — End: 2020-01-11

## 2019-10-30 MED ORDER — TETANUS-DIPHTH-ACELL PERTUSSIS 5-2.5-18.5 LF-MCG/0.5 IM SUSP
0.5000 mL | Freq: Once | INTRAMUSCULAR | Status: AC
  Administered 2019-10-30: 0.5 mL via INTRAMUSCULAR
  Filled 2019-10-30: qty 0.5

## 2019-10-30 MED ORDER — CEPHALEXIN 500 MG PO CAPS: 500.0000 mg | ORAL_CAPSULE | Freq: Three times a day (TID) | ORAL | 0 refills | Status: AC

## 2019-10-30 MED ORDER — LIDOCAINE-EPINEPHRINE (PF) 2 %-1:200000 IJ SOLN
10.0000 mL | Freq: Once | INTRAMUSCULAR | Status: AC
  Administered 2019-10-30: 10 mL
  Filled 2019-10-30: qty 20

## 2019-10-30 MED ORDER — CYCLOBENZAPRINE HCL 10 MG PO TABS
10.0000 mg | ORAL_TABLET | Freq: Two times a day (BID) | ORAL | 0 refills | Status: DC | PRN
Start: 2019-10-30 — End: 2020-01-11
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
## 2019-10-30 NOTE — Discharge Instructions (Signed)
Apply warm compresses to sore muscles for 20 minutes at a time followed by gentle stretching. Apply bacitracin ointment to your forehead wound.  Recommend sunscreen to forehead after suture removal for the next year daily. Follow-up with wound care, referral given.  If you do not see wound care, recommend wound check in 2 days to check for infection, suture removal in 7 days. Take Keflex as prescribed and complete the full course. Take Flexeril as needed as prescribed for muscle tightness. Take naproxen as needed prescribed for muscle pain.
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
## 2019-10-30 NOTE — ED Notes (Signed)
Patient has a blue in the main lab 

## 2019-10-30 NOTE — ED Notes (Signed)
Nonadherent dressing applied to foreheard

## 2020-01-11 ENCOUNTER — Emergency Department (HOSPITAL_BASED_OUTPATIENT_CLINIC_OR_DEPARTMENT_OTHER): Payer: Self-pay

## 2020-01-11 ENCOUNTER — Emergency Department (HOSPITAL_COMMUNITY)
Admission: EM | Admit: 2020-01-11 | Discharge: 2020-01-11 | Disposition: A | Discharge status: Home / Self Care | Payer: Self-pay | Attending: Emergency Medicine | Admitting: Emergency Medicine

## 2020-01-11 ENCOUNTER — Emergency Department (HOSPITAL_COMMUNITY): Payer: Self-pay

## 2020-01-11 ENCOUNTER — Encounter (HOSPITAL_COMMUNITY): Payer: Self-pay

## 2020-01-11 ENCOUNTER — Other Ambulatory Visit: Payer: Self-pay

## 2020-01-11 DIAGNOSIS — G8929 Other chronic pain: Secondary | ICD-10-CM | POA: Insufficient documentation

## 2020-01-11 DIAGNOSIS — M25562 Pain in left knee: Secondary | ICD-10-CM | POA: Insufficient documentation

## 2020-01-11 DIAGNOSIS — M25462 Effusion, left knee: Secondary | ICD-10-CM | POA: Insufficient documentation

## 2020-01-11 DIAGNOSIS — M79609 Pain in unspecified limb: Secondary | ICD-10-CM

## 2020-01-11 DIAGNOSIS — M7989 Other specified soft tissue disorders: Secondary | ICD-10-CM

## 2020-01-11 DIAGNOSIS — F17210 Nicotine dependence, cigarettes, uncomplicated: Secondary | ICD-10-CM | POA: Insufficient documentation

## 2020-01-11 LAB — BASIC METABOLIC PANEL
Anion gap: 9 (ref 5–15)
BUN: 10 mg/dL (ref 6–20)
CO2: 27 mmol/L (ref 22–32)
Calcium: 8.8 mg/dL — ABNORMAL LOW (ref 8.9–10.3)
Chloride: 105 mmol/L (ref 98–111)
Creatinine, Ser: 1.36 mg/dL — ABNORMAL HIGH (ref 0.61–1.24)
GFR, Estimated: >60 mL/min (ref >60)
Glucose, Bld: 87 mg/dL (ref 70–99)
Potassium: 4.1 mmol/L (ref 3.5–5.1)
Sodium: 141 mmol/L (ref 135–145)

## 2020-01-11 LAB — CBC WITH DIFFERENTIAL/PLATELET
Abs Immature Granulocytes: 0.02 10*3/uL (ref 0.00–0.07)
Basophils Absolute: 0 10*3/uL (ref 0.0–0.1)
Basophils Relative: 0 %
Eosinophils Absolute: 0.2 10*3/uL (ref 0.0–0.5)
Eosinophils Relative: 2 %
HCT: 39.8 % (ref 39.0–52.0)
Hemoglobin: 12.9 g/dL — ABNORMAL LOW (ref 13.0–17.0)
Immature Granulocytes: 0 %
Lymphocytes Relative: 27 %
Lymphs Abs: 1.8 10*3/uL (ref 0.7–4.0)
MCH: 30.1 pg (ref 26.0–34.0)
MCHC: 32.4 g/dL (ref 30.0–36.0)
MCV: 92.8 fL (ref 80.0–100.0)
Monocytes Absolute: 0.5 10*3/uL (ref 0.1–1.0)
Monocytes Relative: 8 %
Neutro Abs: 4.3 10*3/uL (ref 1.7–7.7)
Neutrophils Relative %: 63 %
Platelets: 274 10*3/uL (ref 150–400)
RBC: 4.29 MIL/uL (ref 4.22–5.81)
RDW: 13.6 % (ref 11.5–15.5)
WBC: 6.8 10*3/uL (ref 4.0–10.5)
nRBC: 0 % (ref 0.0–0.2)

## 2020-01-11 MED ORDER — NAPROXEN 500 MG PO TABS
500.0000 mg | ORAL_TABLET | Freq: Two times a day (BID) | ORAL | 0 refills | Status: DC
Start: 2020-01-11 — End: 2020-02-06

## 2020-01-11 NOTE — ED Notes (Signed)
Pt. Left knee is visibly swollen with soft tissue swelling (lump) on the lateral interior distal  portion of the knee  When the knee is bent. Pt reports swelling comes and goes, but has been getting worse the last month.

## 2020-01-11 NOTE — ED Notes (Signed)
Off floor to xray.
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
## 2020-01-11 NOTE — Progress Notes (Signed)
Orthopedic Tech Progress Note Patient Details:  Cory Henderson September 13, 1975 [id number] Patient couldn't put on knee sleeve because of pants. Patient stated he would put it on when he got home. Ortho Devices Type of Ortho Device: Crutches, Knee Sleeve Ortho Device/Splint Location: LLE Ortho Device/Splint Interventions: Ordered, Adjustment   Post Interventions Instructions Provided: Care of device, Adjustment of device, Poper ambulation with device   Cory Henderson 01/11/2020, 5:46 PM

## 2020-01-11 NOTE — Discharge Instructions (Signed)
Please read and follow all provided instructions.  Your diagnoses today include:  1. Effusion of left knee     Tests performed today include:  An x-ray of the affected area -shows chronic fracture of your patella (kneecap)  Ultrasound of your leg -shows fluid around your knee, no blood clot  Blood cell counts -were normal  Electrolytes -normal  Kidney function test -slightly weak kidney function, same as previous  Vital signs. See below for your results today.   Medications prescribed:   Naproxen - anti-inflammatory pain medication  Do not exceed 500mg  naproxen every 12 hours, take with food  You have been prescribed an anti-inflammatory medication or NSAID. Take with food. Take smallest effective dose for the shortest duration needed for your pain. Stop taking if you experience stomach pain or vomiting.   Take any prescribed medications only as directed.  Home care instructions:   Follow any educational materials contained in this packet  Follow R.I.C.E. Protocol:  R - rest your injury   I  - use ice on injury without applying directly to skin  C - compress injury with bandage or splint  E - elevate the injury as much as possible  Follow-up instructions: Please follow-up with your primary care provider or the provided orthopedic physician (bone specialist) if you continue to have significant pain in 1 week.  Return instructions:   Please return if your toes or feet are numb or tingling, appear gray or blue, or you have severe pain (also elevate the leg and loosen splint or wrap if you were given one)  Please return to the Emergency Department if you experience worsening symptoms.   Please return if you have any other emergent concerns.  Additional Information:  Your vital signs today were: BP (!) 138/94 (BP Location: Left Arm)   Pulse 62   Temp 98.1 F (36.7 C) (Oral)   Resp 14   SpO2 99%  If your blood pressure (BP) was elevated above 135/85 this  visit, please have this repeated by your doctor within one month. -------------- If prescribed crutches for your injury: use crutches with non-weight bearing for the first few days. Then, you may walk as the pain allows, or as instructed. Start gradually with weight bearing on the affected side. Once you can walk pain free, then try jogging. When you can run forwards, then you can try moving side-to-side. If you cannot walk without crutches in one week, you need a re-check. --------------

## 2020-01-11 NOTE — ED Triage Notes (Signed)
Pt reports swelling and pain to Left leg x1 month. Unable to put weight on his leg

## 2020-01-11 NOTE — Progress Notes (Signed)
Lower extremity venous LT study completed.  Preliminary results relayed to [place], [place].   See CV Proc for preliminary results report.   Jean Rosenthal, RDMS

## 2020-02-02 ENCOUNTER — Inpatient Hospital Stay: Payer: Self-pay | Admitting: Physician Assistant

## 2020-02-06 ENCOUNTER — Encounter (INDEPENDENT_AMBULATORY_CARE_PROVIDER_SITE_OTHER): Payer: Self-pay | Admitting: Primary Care

## 2020-02-06 ENCOUNTER — Other Ambulatory Visit: Payer: Self-pay

## 2020-02-06 ENCOUNTER — Ambulatory Visit (INDEPENDENT_AMBULATORY_CARE_PROVIDER_SITE_OTHER): Payer: Self-pay | Admitting: Primary Care

## 2020-02-06 VITALS — BP 137/97 | HR 62 | Temp 97.5°F | Ht 71.0 in | Wt 187.4 lb

## 2020-02-06 DIAGNOSIS — Z7689 Persons encountering health services in other specified circumstances: Secondary | ICD-10-CM

## 2020-02-06 DIAGNOSIS — R030 Elevated blood-pressure reading, without diagnosis of hypertension: Secondary | ICD-10-CM

## 2020-02-06 DIAGNOSIS — Z09 Encounter for follow-up examination after completed treatment for conditions other than malignant neoplasm: Secondary | ICD-10-CM

## 2020-02-06 NOTE — Patient Instructions (Signed)
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
## 2020-02-06 NOTE — Progress Notes (Signed)
Cant bend leg too far Edema in left knee  Walking around increases pain  Cold weather increases pain

## 2020-03-18 ENCOUNTER — Other Ambulatory Visit: Payer: Self-pay

## 2020-03-18 DIAGNOSIS — Z20822 Contact with and (suspected) exposure to covid-19: Secondary | ICD-10-CM | POA: Insufficient documentation

## 2020-03-18 DIAGNOSIS — R519 Headache, unspecified: Secondary | ICD-10-CM | POA: Insufficient documentation

## 2020-03-18 DIAGNOSIS — Z5321 Procedure and treatment not carried out due to patient leaving prior to being seen by health care provider: Secondary | ICD-10-CM | POA: Insufficient documentation

## 2020-03-18 LAB — POC SARS CORONAVIRUS 2 AG -  ED: SARS Coronavirus 2 Ag: NEGATIVE

## 2020-03-18 MED ORDER — ACETAMINOPHEN 325 MG PO TABS
650.0000 mg | ORAL_TABLET | Freq: Once | ORAL | Status: DC
  Filled 2020-03-18: qty 2

## 2020-03-18 NOTE — ED Triage Notes (Signed)
Pt came in with c/o headache. States he needs a negative Covid test for work. No other symptoms noted

## 2020-03-19 ENCOUNTER — Emergency Department (HOSPITAL_COMMUNITY)
Admission: EM | Admit: 2020-03-19 | Discharge: 2020-03-19 | Disposition: A | Discharge status: Home / Self Care | Payer: Self-pay | Attending: Emergency Medicine | Admitting: Emergency Medicine

## 2021-08-06 IMAGING — DX DG KNEE COMPLETE 4+V*L*
4 series · 4 of 4 positions shown · non-contrast
Comparison: May 29, 2015.

CLINICAL DATA: Left knee pain without recent injury.

EXAM:
LEFT KNEE - COMPLETE 4+ VIEW

[knee ap]
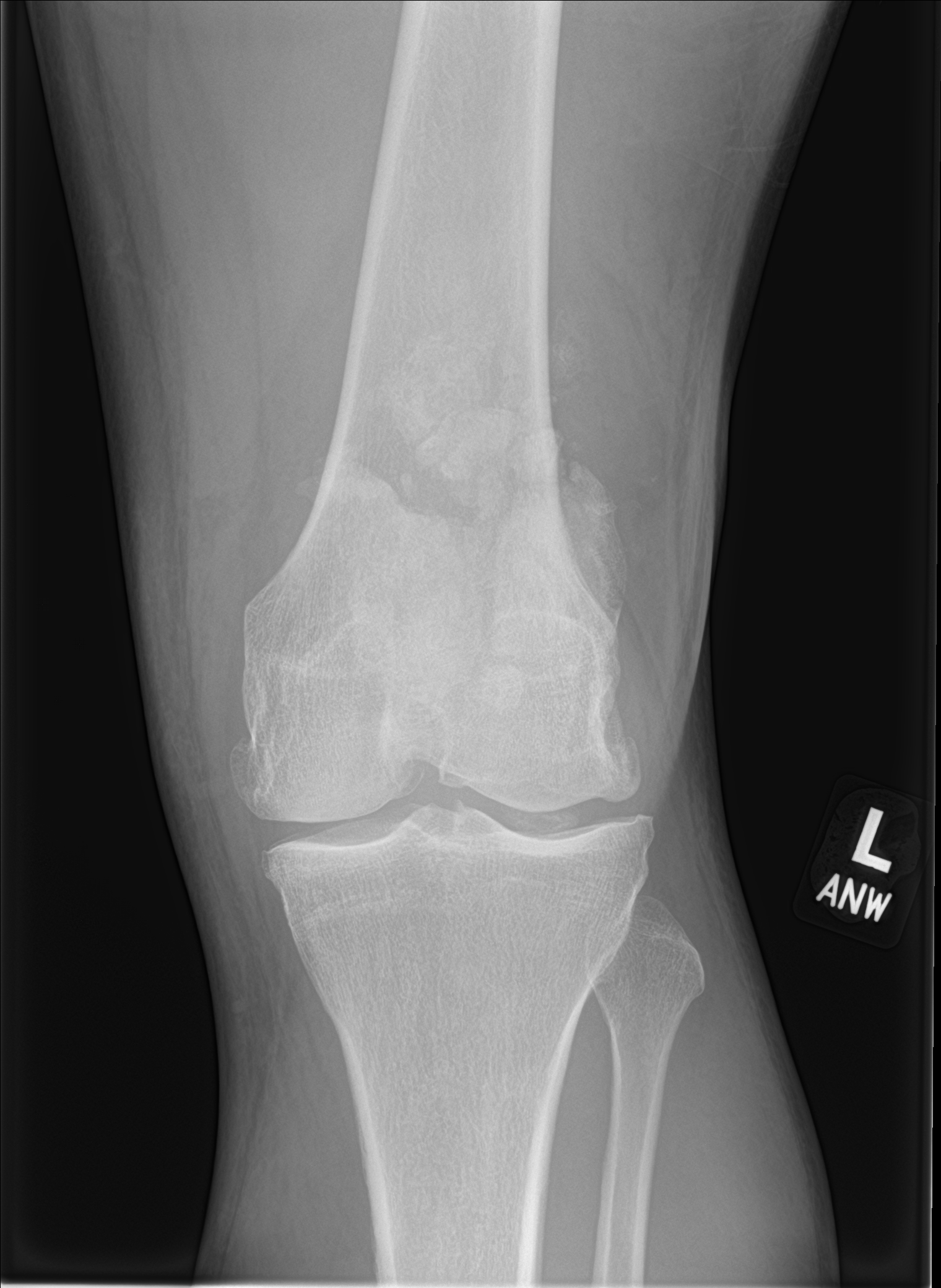

[knee lat]
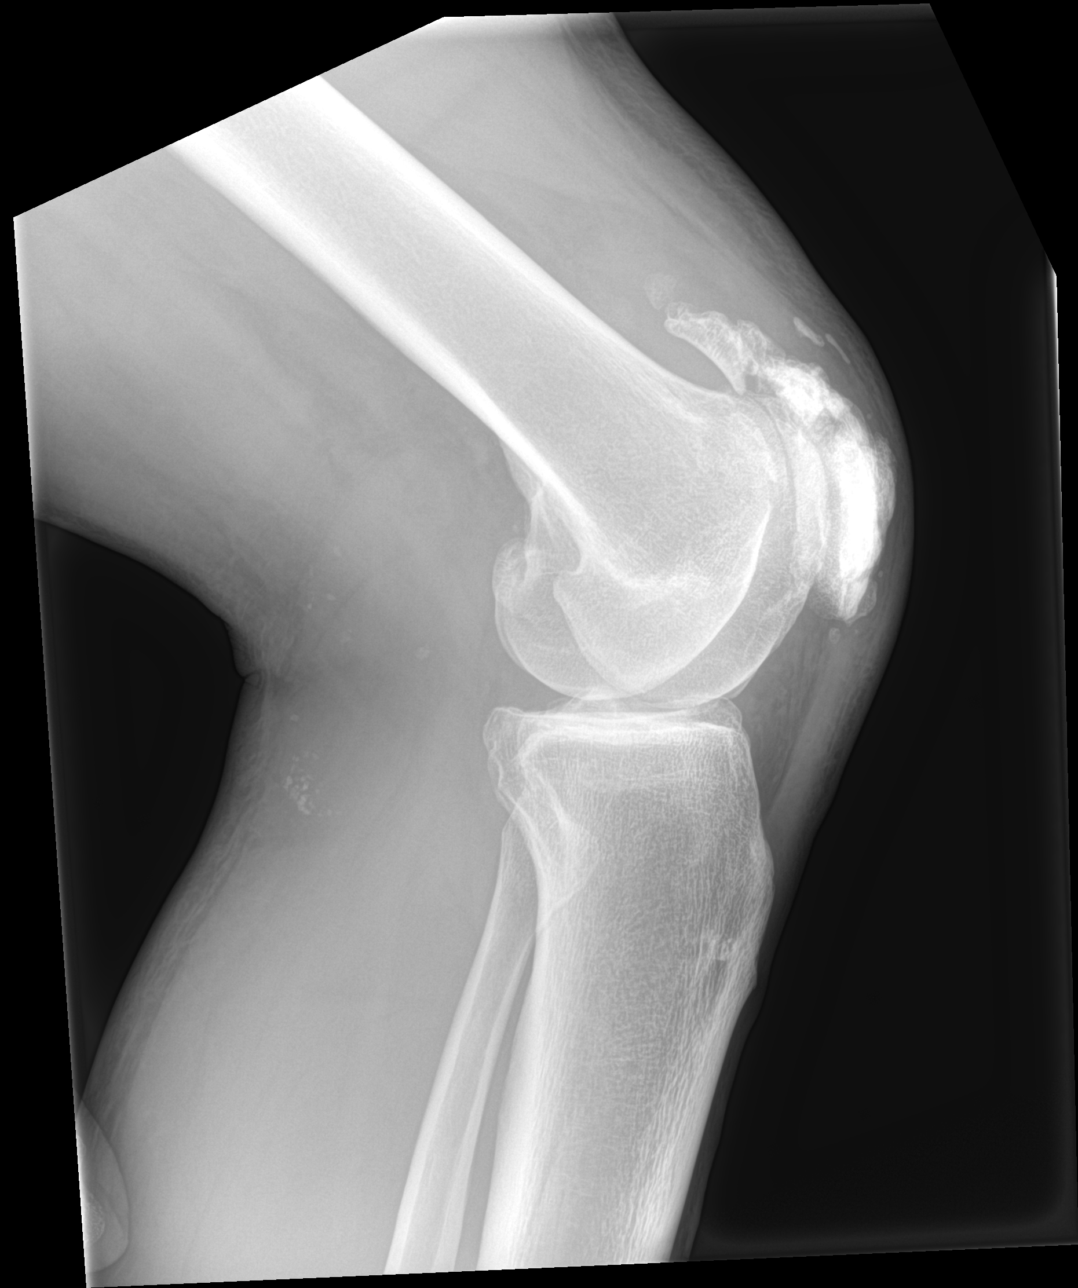

[knee obl (1 of 2)]
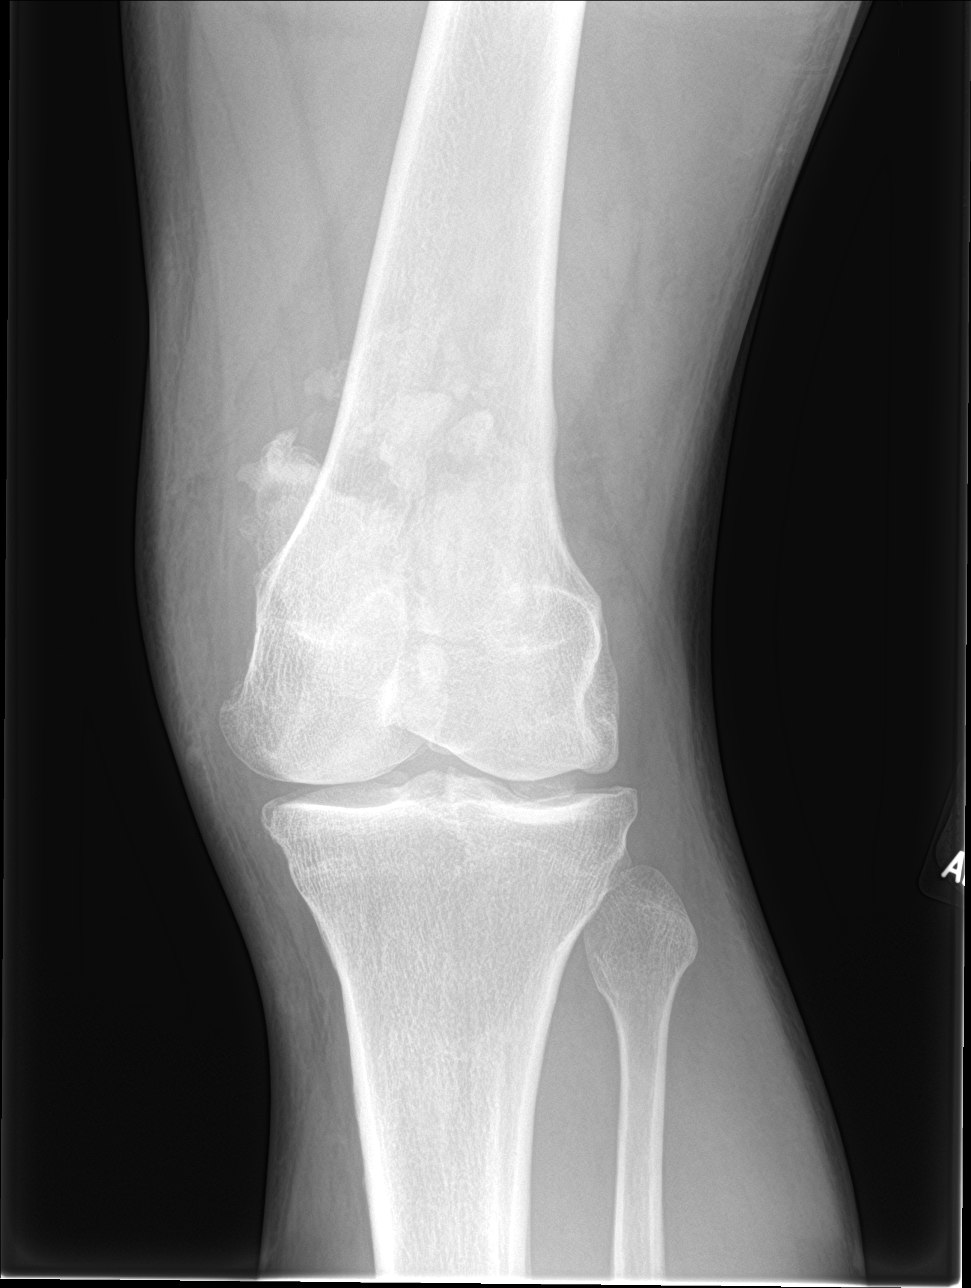

[knee obl (2 of 2)]
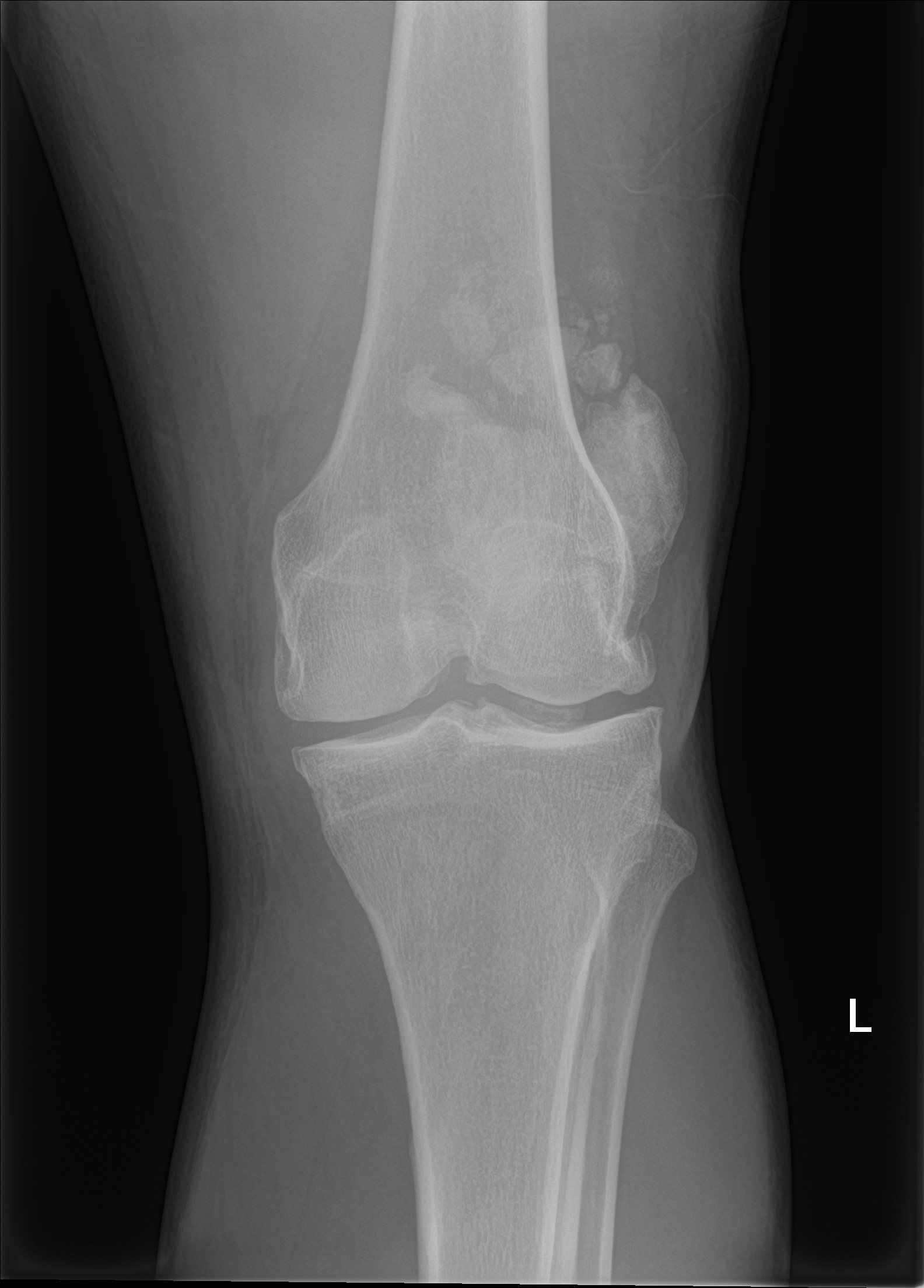

[4 of 4 positions shown; findings below may reference images not displayed]

FINDINGS: Stable extensive posttraumatic deformity involving the left patella
consistent with nonunion of multiple fracture fragments. No acute
fracture or dislocation is noted. Mild narrowing of medial and
lateral joint spaces are noted with osteophyte formation. No soft
tissue abnormality is noted. No joint effusion is noted.
IMPRESSION: Stable extensive posttraumatic deformity involving the left patella
consistent with nonunion of multiple fracture fragments. Mild
degenerative joint disease is noted. No acute abnormality is noted.

## 2021-10-02 ENCOUNTER — Inpatient Hospital Stay (HOSPITAL_COMMUNITY)
Admission: EM | Admit: 2021-10-02 | Discharge: 2021-10-07 | DRG: 683 | Disposition: A | Discharge status: Home / Self Care | Payer: Self-pay | Attending: Family Medicine | Admitting: Family Medicine

## 2021-10-02 ENCOUNTER — Emergency Department (HOSPITAL_COMMUNITY): Payer: Self-pay

## 2021-10-02 DIAGNOSIS — I440 Atrioventricular block, first degree: Secondary | ICD-10-CM | POA: Diagnosis present

## 2021-10-02 DIAGNOSIS — R55 Syncope and collapse: Principal | ICD-10-CM

## 2021-10-02 DIAGNOSIS — Z7982 Long term (current) use of aspirin: Secondary | ICD-10-CM

## 2021-10-02 DIAGNOSIS — N179 Acute kidney failure, unspecified: Principal | ICD-10-CM | POA: Diagnosis present

## 2021-10-02 DIAGNOSIS — E872 Acidosis, unspecified: Secondary | ICD-10-CM | POA: Diagnosis present

## 2021-10-02 DIAGNOSIS — F1010 Alcohol abuse, uncomplicated: Secondary | ICD-10-CM | POA: Diagnosis present

## 2021-10-02 DIAGNOSIS — F319 Bipolar disorder, unspecified: Secondary | ICD-10-CM | POA: Diagnosis present

## 2021-10-02 DIAGNOSIS — E785 Hyperlipidemia, unspecified: Secondary | ICD-10-CM | POA: Diagnosis present

## 2021-10-02 DIAGNOSIS — G4700 Insomnia, unspecified: Secondary | ICD-10-CM | POA: Diagnosis present

## 2021-10-02 DIAGNOSIS — I959 Hypotension, unspecified: Secondary | ICD-10-CM | POA: Diagnosis present

## 2021-10-02 DIAGNOSIS — I9589 Other hypotension: Secondary | ICD-10-CM | POA: Diagnosis present

## 2021-10-02 DIAGNOSIS — I10 Essential (primary) hypertension: Secondary | ICD-10-CM | POA: Diagnosis present

## 2021-10-02 DIAGNOSIS — F17210 Nicotine dependence, cigarettes, uncomplicated: Secondary | ICD-10-CM | POA: Diagnosis present

## 2021-10-02 DIAGNOSIS — D72828 Other elevated white blood cell count: Secondary | ICD-10-CM | POA: Diagnosis present

## 2021-10-02 DIAGNOSIS — F209 Schizophrenia, unspecified: Secondary | ICD-10-CM | POA: Diagnosis present

## 2021-10-02 DIAGNOSIS — E860 Dehydration: Secondary | ICD-10-CM | POA: Diagnosis present

## 2021-10-02 DIAGNOSIS — T43501A Poisoning by unspecified antipsychotics and neuroleptics, accidental (unintentional), initial encounter: Secondary | ICD-10-CM | POA: Diagnosis present

## 2021-10-02 DIAGNOSIS — Z79899 Other long term (current) drug therapy: Secondary | ICD-10-CM

## 2021-10-02 HISTORY — DX: Bipolar disorder, unspecified: F31.9

## 2021-10-02 HISTORY — DX: Hyperlipidemia, unspecified: E78.5

## 2021-10-02 HISTORY — DX: Schizophrenia, unspecified: F20.9

## 2021-10-02 HISTORY — DX: Anxiety disorder, unspecified: F41.9

## 2021-10-02 HISTORY — DX: Essential (primary) hypertension: I10

## 2021-10-02 MED ORDER — SODIUM CHLORIDE 0.9 % IV BOLUS
1000.0000 mL | Freq: Once | INTRAVENOUS | Status: AC
  Administered 2021-10-03: 1000 mL via INTRAVENOUS

## 2021-10-02 MED ORDER — SODIUM CHLORIDE 0.9 % IV SOLN
INTRAVENOUS | Status: DC
Start: 2021-10-03 — End: 2021-10-06
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
## 2021-10-03 ENCOUNTER — Emergency Department (HOSPITAL_COMMUNITY): Payer: Self-pay

## 2021-10-03 ENCOUNTER — Observation Stay (HOSPITAL_COMMUNITY): Payer: Self-pay

## 2021-10-03 ENCOUNTER — Other Ambulatory Visit: Payer: Self-pay

## 2021-10-03 ENCOUNTER — Encounter (HOSPITAL_COMMUNITY): Payer: Self-pay | Admitting: Emergency Medicine

## 2021-10-03 ENCOUNTER — Observation Stay (HOSPITAL_BASED_OUTPATIENT_CLINIC_OR_DEPARTMENT_OTHER): Payer: Self-pay

## 2021-10-03 DIAGNOSIS — T43501A Poisoning by unspecified antipsychotics and neuroleptics, accidental (unintentional), initial encounter: Secondary | ICD-10-CM | POA: Insufficient documentation

## 2021-10-03 DIAGNOSIS — I959 Hypotension, unspecified: Secondary | ICD-10-CM | POA: Diagnosis present

## 2021-10-03 DIAGNOSIS — R55 Syncope and collapse: Secondary | ICD-10-CM

## 2021-10-03 DIAGNOSIS — F209 Schizophrenia, unspecified: Secondary | ICD-10-CM | POA: Diagnosis present

## 2021-10-03 DIAGNOSIS — E785 Hyperlipidemia, unspecified: Secondary | ICD-10-CM | POA: Diagnosis present

## 2021-10-03 DIAGNOSIS — I10 Essential (primary) hypertension: Secondary | ICD-10-CM | POA: Diagnosis present

## 2021-10-03 DIAGNOSIS — N179 Acute kidney failure, unspecified: Secondary | ICD-10-CM

## 2021-10-03 DIAGNOSIS — F319 Bipolar disorder, unspecified: Secondary | ICD-10-CM

## 2021-10-03 LAB — COMPREHENSIVE METABOLIC PANEL
ALT: 21 U/L (ref 0–44)
AST: 20 U/L (ref 15–41)
Albumin: 3.6 g/dL (ref 3.5–5.0)
Alkaline Phosphatase: 54 U/L (ref 38–126)
Anion gap: 10 (ref 5–15)
BUN: 20 mg/dL (ref 6–20)
CO2: 21 mmol/L — ABNORMAL LOW (ref 22–32)
Calcium: 8.2 mg/dL — ABNORMAL LOW (ref 8.9–10.3)
Chloride: 112 mmol/L — ABNORMAL HIGH (ref 98–111)
Creatinine, Ser: 2.69 mg/dL — ABNORMAL HIGH (ref 0.61–1.24)
GFR, Estimated: 29 mL/min — ABNORMAL LOW (ref >60)
Glucose, Bld: 126 mg/dL — ABNORMAL HIGH (ref 70–99)
Potassium: 4.1 mmol/L (ref 3.5–5.1)
Sodium: 143 mmol/L (ref 135–145)
Total Bilirubin: 0.6 mg/dL (ref 0.3–1.2)
Total Protein: 5.7 g/dL — ABNORMAL LOW (ref 6.5–8.1)

## 2021-10-03 LAB — CBC WITH DIFFERENTIAL/PLATELET
Abs Immature Granulocytes: 0.03 10*3/uL (ref 0.00–0.07)
Abs Immature Granulocytes: 0.09 10*3/uL — ABNORMAL HIGH (ref 0.00–0.07)
Basophils Absolute: 0 10*3/uL (ref 0.0–0.1)
Basophils Absolute: 0 10*3/uL (ref 0.0–0.1)
Basophils Relative: 0 %
Basophils Relative: 0 %
Eosinophils Absolute: 0 10*3/uL (ref 0.0–0.5)
Eosinophils Absolute: 0.1 10*3/uL (ref 0.0–0.5)
Eosinophils Relative: 0 %
Eosinophils Relative: 1 %
HCT: 37.2 % — ABNORMAL LOW (ref 39.0–52.0)
HCT: 40.4 % (ref 39.0–52.0)
Hemoglobin: 12.5 g/dL — ABNORMAL LOW (ref 13.0–17.0)
Hemoglobin: 13.8 g/dL (ref 13.0–17.0)
Immature Granulocytes: 0 %
Immature Granulocytes: 1 %
Lymphocytes Relative: 11 %
Lymphocytes Relative: 22 %
Lymphs Abs: 1.6 10*3/uL (ref 0.7–4.0)
Lymphs Abs: 2.4 10*3/uL (ref 0.7–4.0)
MCH: 30.1 pg (ref 26.0–34.0)
MCH: 30.3 pg (ref 26.0–34.0)
MCHC: 33.6 g/dL (ref 30.0–36.0)
MCHC: 34.2 g/dL (ref 30.0–36.0)
MCV: 88 fL (ref 80.0–100.0)
MCV: 90.3 fL (ref 80.0–100.0)
Monocytes Absolute: 1 10*3/uL (ref 0.1–1.0)
Monocytes Absolute: 1.1 10*3/uL — ABNORMAL HIGH (ref 0.1–1.0)
Monocytes Relative: 7 %
Monocytes Relative: 9 %
Neutro Abs: 12.1 10*3/uL — ABNORMAL HIGH (ref 1.7–7.7)
Neutro Abs: 7.1 10*3/uL (ref 1.7–7.7)
Neutrophils Relative %: 68 %
Neutrophils Relative %: 81 %
Platelets: 259 10*3/uL (ref 150–400)
Platelets: 265 10*3/uL (ref 150–400)
RBC: 4.12 MIL/uL — ABNORMAL LOW (ref 4.22–5.81)
RBC: 4.59 MIL/uL (ref 4.22–5.81)
RDW: 13.9 % (ref 11.5–15.5)
RDW: 14.3 % (ref 11.5–15.5)
WBC: 10.7 10*3/uL — ABNORMAL HIGH (ref 4.0–10.5)
WBC: 14.9 10*3/uL — ABNORMAL HIGH (ref 4.0–10.5)
nRBC: 0 % (ref 0.0–0.2)
nRBC: 0 % (ref 0.0–0.2)

## 2021-10-03 LAB — I-STAT CHEM 8, ED
BUN: 19 mg/dL (ref 6–20)
Calcium, Ion: 1.02 mmol/L — ABNORMAL LOW (ref 1.15–1.40)
Chloride: 106 mmol/L (ref 98–111)
Creatinine, Ser: 2.9 mg/dL — ABNORMAL HIGH (ref 0.61–1.24)
Glucose, Bld: 122 mg/dL — ABNORMAL HIGH (ref 70–99)
HCT: 41 % (ref 39.0–52.0)
Hemoglobin: 13.9 g/dL (ref 13.0–17.0)
Potassium: 4 mmol/L (ref 3.5–5.1)
Sodium: 142 mmol/L (ref 135–145)
TCO2: 23 mmol/L (ref 22–32)

## 2021-10-03 LAB — BASIC METABOLIC PANEL
Anion gap: 3 — ABNORMAL LOW (ref 5–15)
BUN: 14 mg/dL (ref 6–20)
CO2: 24 mmol/L (ref 22–32)
Calcium: 7.8 mg/dL — ABNORMAL LOW (ref 8.9–10.3)
Chloride: 115 mmol/L — ABNORMAL HIGH (ref 98–111)
Creatinine, Ser: 1.53 mg/dL — ABNORMAL HIGH (ref 0.61–1.24)
GFR, Estimated: 57 mL/min — ABNORMAL LOW (ref >60)
Glucose, Bld: 87 mg/dL (ref 70–99)
Potassium: 4.1 mmol/L (ref 3.5–5.1)
Sodium: 142 mmol/L (ref 135–145)

## 2021-10-03 LAB — MAGNESIUM: Magnesium: 2.2 mg/dL (ref 1.7–2.4)

## 2021-10-03 LAB — LACTIC ACID, PLASMA
Lactic Acid, Venous: 2.9 mmol/L (ref 0.5–1.9)
Lactic Acid, Venous: >9 mmol/L (ref 0.5–1.9)
Lactic Acid, Venous: 2 mmol/L (ref 0.5–1.9)
Lactic Acid, Venous: 1.3 mmol/L (ref 0.5–1.9)

## 2021-10-03 LAB — CK: Total CK: 136 U/L (ref 49–397)

## 2021-10-03 LAB — SALICYLATE LEVEL: Salicylate Lvl: <7 mg/dL — ABNORMAL LOW (ref 7.0–30.0)

## 2021-10-03 LAB — CBG MONITORING, ED: Glucose-Capillary: 121 mg/dL — ABNORMAL HIGH (ref 70–99)

## 2021-10-03 LAB — PROTIME-INR
INR: 1.1 (ref 0.8–1.2)
Prothrombin Time: 14 seconds (ref 11.4–15.2)

## 2021-10-03 LAB — ETHANOL: Alcohol, Ethyl (B): <10 mg/dL (ref <10)

## 2021-10-03 LAB — ACETAMINOPHEN LEVEL: Acetaminophen (Tylenol), Serum: <10 ug/mL — ABNORMAL LOW (ref 10–30)

## 2021-10-03 LAB — TROPONIN I (HIGH SENSITIVITY)
Troponin I (High Sensitivity): 14 ng/L (ref <18)
Troponin I (High Sensitivity): 23 ng/L — ABNORMAL HIGH (ref <18)

## 2021-10-03 LAB — HIV ANTIBODY (ROUTINE TESTING W REFLEX): HIV Screen 4th Generation wRfx: NONREACTIVE

## 2021-10-03 MED ORDER — KCL IN DEXTROSE-NACL 20-5-0.9 MEQ/L-%-% IV SOLN
INTRAVENOUS | Status: DC
  Filled 2021-10-03 (×3): qty 1000

## 2021-10-03 MED ORDER — ACETAMINOPHEN 650 MG RE SUPP: 650.0000 mg | Freq: Four times a day (QID) | RECTAL | Status: DC | PRN

## 2021-10-03 MED ORDER — LORAZEPAM 2 MG/ML IJ SOLN: 1.0000 mg | INTRAMUSCULAR | Status: DC | PRN

## 2021-10-03 MED ORDER — ADULT MULTIVITAMIN W/MINERALS CH
1.0000 | ORAL_TABLET | Freq: Every day | ORAL | Status: DC
Start: 2021-10-04 — End: 2021-10-07
  Administered 2021-10-04 – 2021-10-07 (×4): 1 via ORAL
  Filled 2021-10-03 (×4): qty 1

## 2021-10-03 MED ORDER — THIAMINE HCL 100 MG PO TABS
100.0000 mg | ORAL_TABLET | Freq: Every day | ORAL | Status: DC
  Administered 2021-10-03: 100 mg via ORAL
  Filled 2021-10-03: qty 1

## 2021-10-03 MED ORDER — LACTATED RINGERS IV SOLN: INTRAVENOUS | Status: DC

## 2021-10-03 MED ORDER — ATORVASTATIN CALCIUM 40 MG PO TABS
40.0000 mg | ORAL_TABLET | Freq: Every day | ORAL | Status: DC
  Administered 2021-10-03 – 2021-10-07 (×5): 40 mg via ORAL
  Filled 2021-10-03 (×5): qty 1

## 2021-10-03 MED ORDER — THIAMINE HCL 100 MG/ML IJ SOLN
100.0000 mg | Freq: Every day | INTRAMUSCULAR | Status: DC
  Filled 2021-10-03: qty 2

## 2021-10-03 MED ORDER — ENOXAPARIN SODIUM 40 MG/0.4ML IJ SOSY
40.0000 mg | PREFILLED_SYRINGE | INTRAMUSCULAR | Status: DC
Start: 2021-10-03 — End: 2021-10-03
  Administered 2021-10-03: 40 mg via SUBCUTANEOUS
  Filled 2021-10-03: qty 0.4

## 2021-10-03 MED ORDER — THIAMINE HCL 100 MG PO TABS
100.0000 mg | ORAL_TABLET | Freq: Every day | ORAL | Status: DC
Start: 2021-10-04 — End: 2021-10-07
  Administered 2021-10-04 – 2021-10-07 (×4): 100 mg via ORAL
  Filled 2021-10-03 (×4): qty 1

## 2021-10-03 MED ORDER — ACETAMINOPHEN 325 MG PO TABS
650.0000 mg | ORAL_TABLET | Freq: Four times a day (QID) | ORAL | Status: DC | PRN
  Administered 2021-10-06: 650 mg via ORAL
  Filled 2021-10-03: qty 2

## 2021-10-03 MED ORDER — LORAZEPAM 1 MG PO TABS
1.0000 mg | ORAL_TABLET | ORAL | Status: DC | PRN
  Administered 2021-10-04 – 2021-10-06 (×3): 1 mg via ORAL
  Filled 2021-10-03 (×3): qty 1

## 2021-10-03 MED ORDER — FOLIC ACID 1 MG PO TABS
1.0000 mg | ORAL_TABLET | Freq: Every day | ORAL | Status: DC
  Administered 2021-10-04 – 2021-10-07 (×4): 1 mg via ORAL
  Filled 2021-10-03 (×4): qty 1

## 2021-10-03 MED ORDER — NICOTINE 21 MG/24HR TD PT24
21.0000 mg | MEDICATED_PATCH | Freq: Every day | TRANSDERMAL | Status: DC
Start: 2021-10-03 — End: 2021-10-07
  Administered 2021-10-03 – 2021-10-07 (×5): 21 mg via TRANSDERMAL
  Filled 2021-10-03 (×5): qty 1

## 2021-10-03 MED ORDER — SODIUM CHLORIDE 0.9% FLUSH
3.0000 mL | Freq: Two times a day (BID) | INTRAVENOUS | Status: DC
  Administered 2021-10-03 – 2021-10-07 (×4): 3 mL via INTRAVENOUS

## 2021-10-03 MED ORDER — MELATONIN 3 MG PO TABS
3.0000 mg | ORAL_TABLET | Freq: Once | ORAL | Status: AC
  Administered 2021-10-03: 3 mg via ORAL
  Filled 2021-10-03: qty 1

## 2021-10-03 MED ORDER — ADULT MULTIVITAMIN W/MINERALS CH
1.0000 | ORAL_TABLET | Freq: Every day | ORAL | Status: DC
  Administered 2021-10-03: 1 via ORAL
  Filled 2021-10-03: qty 1

## 2021-10-03 MED ORDER — ALBUTEROL SULFATE (2.5 MG/3ML) 0.083% IN NEBU: 2.5000 mg | INHALATION_SOLUTION | RESPIRATORY_TRACT | Status: DC | PRN

## 2021-10-03 MED ORDER — FOLIC ACID 1 MG PO TABS
1.0000 mg | ORAL_TABLET | Freq: Every day | ORAL | Status: DC
  Administered 2021-10-03: 1 mg via ORAL
  Filled 2021-10-03: qty 1

## 2021-10-03 NOTE — ED Notes (Signed)
Notified Dr. Eudelia Bunch, pt's BP now trending 95/64. Instructed to admin 500 cc bolus.

## 2021-10-03 NOTE — Progress Notes (Signed)
EEG complete - results pending 
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
## 2021-10-03 NOTE — ED Notes (Signed)
Notified Dr. Eudelia Bunch of repeat lactic >9.0. Orders received to redraw a repeat lactic to confirm finding.
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
## 2021-10-03 NOTE — Progress Notes (Signed)
  Echocardiogram 2D Echocardiogram has been performed.  Cory Henderson 10/03/2021, 3:28 PM

## 2021-10-03 NOTE — Hospital Course (Addendum)
46 year old man PMH bipolar and schizophrenia presented after loss of consciousness at home.  Found to have acute kidney injury, admitted for further evaluation.  Extensive discussion with sister provided further history: 2 episodes of loss of consciousness/syncope this week, both occurring after Seroquel and Ambien, the first time also in the context of some tequila.  No shaking or seizure-like activity, first event occurred while sitting on the couch, second while getting up and moving and recurred several times.  In about the last 6 weeks Seroquel dose was increased and was allowed to take 2 Ambien as needed.  No previous history of seizures.  Echocardiogram suggested the possibility of myxoma or small thrombus right atrium.  Plan for TEE 7/24 and likely discharge thereafter.

## 2021-10-04 DIAGNOSIS — R4182 Altered mental status, unspecified: Secondary | ICD-10-CM

## 2021-10-04 DIAGNOSIS — F209 Schizophrenia, unspecified: Secondary | ICD-10-CM

## 2021-10-04 DIAGNOSIS — F319 Bipolar disorder, unspecified: Secondary | ICD-10-CM

## 2021-10-04 LAB — ECHOCARDIOGRAM COMPLETE
AR max vel: 2.51 cm2
AV Area VTI: 2.63 cm2
AV Area mean vel: 2.7 cm2
AV Mean grad: 7 mmHg
AV Peak grad: 13.7 mmHg
Ao pk vel: 1.85 m/s
Area-P 1/2: 3.56 cm2
Calc EF: 60.4 %
Height: 72 in
S' Lateral: 2.9 cm
Single Plane A2C EF: 62 %
Single Plane A4C EF: 56.3 %
Weight: 3167.57 oz

## 2021-10-04 LAB — BASIC METABOLIC PANEL
Anion gap: 9 (ref 5–15)
BUN: 15 mg/dL (ref 6–20)
CO2: 26 mmol/L (ref 22–32)
Calcium: 8.6 mg/dL — ABNORMAL LOW (ref 8.9–10.3)
Chloride: 107 mmol/L (ref 98–111)
Creatinine, Ser: 1.34 mg/dL — ABNORMAL HIGH (ref 0.61–1.24)
GFR, Estimated: >60 mL/min (ref >60)
Glucose, Bld: 92 mg/dL (ref 70–99)
Potassium: 4.2 mmol/L (ref 3.5–5.1)
Sodium: 142 mmol/L (ref 135–145)

## 2021-10-04 LAB — CBC
HCT: 35.9 % — ABNORMAL LOW (ref 39.0–52.0)
Hemoglobin: 12.1 g/dL — ABNORMAL LOW (ref 13.0–17.0)
MCH: 29.6 pg (ref 26.0–34.0)
MCHC: 33.7 g/dL (ref 30.0–36.0)
MCV: 87.8 fL (ref 80.0–100.0)
Platelets: 238 10*3/uL (ref 150–400)
RBC: 4.09 MIL/uL — ABNORMAL LOW (ref 4.22–5.81)
RDW: 14.1 % (ref 11.5–15.5)
WBC: 8.3 10*3/uL (ref 4.0–10.5)
nRBC: 0 % (ref 0.0–0.2)

## 2021-10-04 NOTE — Procedures (Signed)
Patient Name: Cory Henderson  MRN: [id number]  Epilepsy Attending: Charlsie Quest  Referring Physician/Provider: Standley Brooking, MD  Date: 10/03/2021 Duration: 21.19 mins  Patient history: 46 year old male with altered mental status and seizure-like activity.  EEG to evaluate for seizure.  Level of alertness: Awake  AEDs during EEG study: None  Technical aspects: This EEG study was done with scalp electrodes positioned according to the 10-20 International system of electrode placement. Electrical activity was acquired at a sampling rate of 500Hz  and reviewed with a high frequency filter of 70Hz  and a low frequency filter of 1Hz . EEG data were recorded continuously and digitally stored.   Description: The posterior dominant rhythm consists 10 Hz activity of moderate voltage (25-35 uV) seen predominantly in posterior head regions, symmetric and reactive to eye opening and eye closing. Hyperventilation and photic stimulation were not performed.     IMPRESSION: This study is within normal limits. No seizures or epileptiform discharges were seen throughout the recording.  Cory Henderson 
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
## 2021-10-04 NOTE — Progress Notes (Signed)
Progress Note   Patient: Cory Henderson OLM:[id number] DOB: Apr 23, 1975 DOA: 10/02/2021     0 DOS: the patient was seen and examined on 10/04/2021   Brief hospital course: 46 year old man PMH bipolar and schizophrenia presented after loss of consciousness at home.  Found to have acute kidney injury, admitted for further evaluation.  Extensive discussion with sister provided further history: 2 episodes of loss of consciousness/syncope this week, both occurring after Seroquel and Ambien, the first time also in the context of some tequila.  No shaking or seizure-like activity, first event occurred while sitting on the couch, second while getting up and moving and recurred several times.  In about the last 6 weeks Seroquel dose was increased and was allowed to take 2 Ambien as needed.  No previous history of seizures.  Echocardiogram suggested the possibility of myxoma or small thrombus right atrium.  Cardiology evaluation pending.  Assessment and Plan: Syncope --2 episodes in the last week, both occurring at night after taking Seroquel and Ambien, the first occurring also after drinking some tequila.  By history both seem to be syncopal and description without reported seizure-like activity.  Also in context of acute kidney injury and probably poor oral intake. --EEG negative and CT head unremarkable.  Telemetry sinus rhythm, EKG nonacute. --While atypical seizure is in the differential this seems unlikely based on his history.  Favor combination of medication superimposed on acute kidney injury and poor oral intake --No objective findings to suggest cardiogenic in nature, however echocardiogram abnormality as below and fights cardiology evaluation. --No driving (patient does not drive), suggest outpatient follow-up with PCP and 1 time with neurology.  However given the lack of definitive history, no antiepileptic indicated.  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 --Consider  cardiac CT or TEE.  Cardiology consultation requested for recommendations as well as evaluation for syncopal episodes.  Acute hypotension --Resolved.  Probably secondary to poor oral hydration  Acute kidney injury --Resolving rapidly with IV fluids.  May have CKD stage II, cannot definitively say at this time. --Continue IV fluids, check BMP in a.m.  Bipolar 1 disorder, self-reported schizophrenia --Given 2 episodes with increase in Seroquel in the last 6 weeks, he needs close outpatient follow-up with his psychiatrist.  I would recommend stopping Ambien at this time.  Would consider decreasing Seroquel dose. --no history from patient or sister to suggest overdose, accidental or otherwise. Overdose ruled out.  Possible alcohol abuse --Both patient and sister deny this.  No signs or symptoms of withdrawal.      Subjective:  Feels fine, a little chest burning Has this 2x week and spontaneously resolves, occurs at rest and with movement  Physical Exam: Vitals:   10/04/21 0340 10/04/21 0835 10/04/21 1228 10/04/21 1300  BP: 112/70 115/66 112/73 (!) 132/95  Pulse: 67 63 70 75  Resp: 18 20    Temp: 97.7 F (36.5 C) 98.1 F (36.7 C) 98.3 F (36.8 C)   TempSrc: Oral Oral Oral   SpO2: 96% 99% 95% 98%  Weight:      Height:       Physical Exam Vitals reviewed.  Constitutional:      General: He is not in acute distress.    Appearance: He is not ill-appearing or toxic-appearing.  Cardiovascular:     Rate and Rhythm: Normal rate and regular rhythm.     Heart sounds: No murmur heard.    Comments: Telemetry SR Pulmonary:     Effort: Pulmonary effort is normal.  No respiratory distress.     Breath sounds: No wheezing, rhonchi or rales.  Neurological:     Mental Status: He is alert.  Psychiatric:        Mood and Affect: Mood normal.        Behavior: Behavior normal.     Data Reviewed:  Creatinine 2.9 > 1.53 > 1.34 CBC unremarkable EEG normal Echo noted  Family  Communication: sister Danielle by telephoen  Disposition: Status is: Change to inpatient    Planned Discharge Destination: Home    Time spent: 35 minutes  Author: Brendia Sacks, MD 10/04/2021 1:47 PM  For on call review [email or web address].

## 2021-10-05 ENCOUNTER — Inpatient Hospital Stay (HOSPITAL_COMMUNITY): Payer: Self-pay

## 2021-10-05 DIAGNOSIS — I8290 Acute embolism and thrombosis of unspecified vein: Secondary | ICD-10-CM

## 2021-10-05 LAB — BASIC METABOLIC PANEL
Anion gap: 8 (ref 5–15)
BUN: 11 mg/dL (ref 6–20)
CO2: 25 mmol/L (ref 22–32)
Calcium: 8.2 mg/dL — ABNORMAL LOW (ref 8.9–10.3)
Chloride: 103 mmol/L (ref 98–111)
Creatinine, Ser: 1.27 mg/dL — ABNORMAL HIGH (ref 0.61–1.24)
GFR, Estimated: >60 mL/min (ref >60)
Glucose, Bld: 95 mg/dL (ref 70–99)
Potassium: 3.6 mmol/L (ref 3.5–5.1)
Sodium: 136 mmol/L (ref 135–145)

## 2021-10-05 NOTE — Progress Notes (Signed)
VASCULAR LAB    Bilateral lower extremity venous duplex has been performed.  See CV proc for preliminary results.   Linde Wilensky, RVT 10/05/2021, 12:28 PM

## 2021-10-05 NOTE — Progress Notes (Signed)
Progress Note   Patient: Cory Henderson [id number]:[id number] DOB: 04-Aug-1975 DOA: 10/02/2021     1 DOS: the patient was seen and examined on 10/05/2021   Brief hospital course: 46 year old man PMH bipolar and schizophrenia presented after loss of consciousness at home.  Found to have acute kidney injury, admitted for further evaluation.  Extensive discussion with sister provided further history: 2 episodes of loss of consciousness/syncope this week, both occurring after Seroquel and Ambien, the first time also in the context of some tequila.  No shaking or seizure-like activity, first event occurred while sitting on the couch, second while getting up and moving and recurred several times.  In about the last 6 weeks Seroquel dose was increased and was allowed to take 2 Ambien as needed.  No previous history of seizures.  Echocardiogram suggested the possibility of myxoma or small thrombus right atrium.  Cardiology evaluation pending.  Assessment and Plan: Syncope --2 episodes in the last week, both occurring at night after taking Seroquel and Ambien, the first occurring also after drinking some tequila.  By history both seem to be syncopal and description without reported seizure-like activity.  Also in context of acute kidney injury and probably poor oral intake. --EEG negative and CT head unremarkable.  Telemetry sinus rhythm, EKG nonacute. --While atypical seizure is in the differential this seems unlikely based on his history.  Favor combination of medication superimposed on acute kidney injury and poor oral intake --No objective findings to suggest cardiogenic in nature, however echocardiogram abnormality as below --No driving (patient does not drive), suggest outpatient follow-up with PCP and 1 time with neurology.  However given the lack of definitive history, no antiepileptic indicated.   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 --TEE Monday per cardiology; I d/w with Dr.  Jacques Navy, thrombus likelihood thought low and anticoagulation was not recommended at this time.   Acute hypotension --Resolved.  Probably secondary to poor oral hydration   Acute kidney injury --Resolving rapidly with IV fluids.  May have CKD stage II, cannot definitively say at this time. --can stop IVF   Bipolar 1 disorder, self-reported schizophrenia --Given 2 episodes with increase in Seroquel in the last 6 weeks, he needs close outpatient follow-up with his psychiatrist.  I would recommend stopping Ambien at this time.  Would consider decreasing Seroquel dose. --no history from patient or sister to suggest overdose, accidental or otherwise. Overdose ruled out.   Possible alcohol abuse --Both patient and sister deny this.  No signs or symptoms of withdrawal.       Subjective:  Feels fine No complaints No dizziness  Physical Exam: Vitals:   10/05/21 0400 10/05/21 0730 10/05/21 1329 10/05/21 1613  BP: 126/84 129/81 130/89 128/82  Pulse: 65 (!) 57 61 (!) 58  Resp: 16 19  19   Temp: 98.9 F (37.2 C) 98.3 F (36.8 C) 98 F (36.7 C) 98.3 F (36.8 C)  TempSrc: Oral Oral Oral Oral  SpO2: 100% 97%  96%  Weight:      Height:       Physical Exam Vitals reviewed.  Constitutional:      General: He is not in acute distress.    Appearance: He is not ill-appearing or toxic-appearing.  Cardiovascular:     Rate and Rhythm: Normal rate and regular rhythm.     Heart sounds: No murmur heard.    Comments: Telemetry SB Pulmonary:     Effort: Pulmonary effort is normal. No respiratory distress.     Breath  sounds: No wheezing, rhonchi or rales.  Neurological:     Mental Status: He is alert.  Psychiatric:        Mood and Affect: Mood normal.        Behavior: Behavior normal.     Data Reviewed:  Creatinine 1.27, improved, likely at baseline  Family Communication: none  Disposition: Status is: Inpatient Remains inpatient appropriate because: needs TEE  Planned Discharge  Destination: Home    Time spent: 20 minutes  Author: Brendia Sacks, MD 10/05/2021 4:24 PM  For on call review [email or web address].

## 2021-10-06 MED ORDER — TRAZODONE HCL 50 MG PO TABS
50.0000 mg | ORAL_TABLET | Freq: Every day | ORAL | Status: DC
  Administered 2021-10-06: 50 mg via ORAL
  Filled 2021-10-06: qty 1

## 2021-10-06 NOTE — Progress Notes (Signed)
Progress Note   Patient: Cory Henderson [id number] DOB: April 01, 1975 DOA: 10/02/2021     2 DOS: the patient was seen and examined on 10/06/2021   Brief hospital course: 46 year old man PMH bipolar and schizophrenia presented after loss of consciousness at home.  Found to have acute kidney injury, admitted for further evaluation.  Extensive discussion with sister provided further history: 2 episodes of loss of consciousness/syncope this week, both occurring after Seroquel and Ambien, the first time also in the context of some tequila.  No shaking or seizure-like activity, first event occurred while sitting on the couch, second while getting up and moving and recurred several times.  In about the last 6 weeks Seroquel dose was increased and was allowed to take 2 Ambien as needed.  No previous history of seizures.  Echocardiogram suggested the possibility of myxoma or small thrombus right atrium.  Plan for TEE 7/24 and likely discharge thereafter.  Assessment and Plan: Syncope --2 episodes in the last week, both occurring at night after taking Seroquel and Ambien, the first occurring also after drinking some tequila.  By history both seem to be syncopal and description without reported seizure-like activity.  Also in context of acute kidney injury and probably poor oral intake. --EEG negative and CT head unremarkable.  Telemetry sinus rhythm, EKG nonacute. --While atypical seizure is in the differential this seems unlikely based on his history.  Favor combination of medication superimposed on acute kidney injury and poor oral intake --No objective findings to suggest cardiogenic in nature, however echocardiogram abnormality as below --No driving (patient does not drive), suggest outpatient follow-up with PCP and 1 time with neurology.  However given the lack of definitive history, no antiepileptic indicated.   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 --TEE Monday per  cardiology; I d/w with Dr. Jacques Navy, thrombus likelihood thought low and anticoagulation was not recommended at this time.   Acute hypotension --Resolved.  Probably secondary to poor oral hydration   Acute kidney injury --Resolved rapidly with IV fluids.  May have CKD stage II, cannot definitively say at this time.   Bipolar 1 disorder, self-reported schizophrenia --Given 2 episodes with increase in Seroquel in the last 6 weeks, he needs close outpatient follow-up with his psychiatrist.  I would recommend stopping Ambien at this time.  Would consider decreasing Seroquel dose. --no history from patient or sister to suggest overdose, accidental or otherwise. Overdose ruled out.   Possible alcohol abuse --Both patient and sister deny this.  No signs or symptoms of withdrawal.     Subjective:  Feels fine  Physical Exam: Vitals:   10/05/21 1613 10/05/21 2046 10/06/21 0125 10/06/21 0900  BP: 128/82 136/89 133/86 (!) 139/94  Pulse: (!) 58 61 66 64  Resp: 19 16 15 16   Temp: 98.3 F (36.8 C) 98.5 F (36.9 C) 98.1 F (36.7 C) 98 F (36.7 C)  TempSrc: Oral Oral Oral Oral  SpO2: 96% 97% 97% 98%  Weight:      Height:       Physical Exam Vitals reviewed.  Constitutional:      General: He is not in acute distress.    Appearance: He is not ill-appearing or toxic-appearing.  Cardiovascular:     Rate and Rhythm: Normal rate and regular rhythm.     Heart sounds: No murmur heard.    Comments: SR on telemetry Pulmonary:     Effort: Pulmonary effort is normal. No respiratory distress.     Breath sounds: No wheezing  or rales.  Neurological:     Mental Status: He is alert.  Psychiatric:        Mood and Affect: Mood normal.        Behavior: Behavior normal.     Data Reviewed:  Creatinine down to 1.27 BLE dopplers negative  Family Communication: none   Disposition: Status is: Inpatient Remains inpatient appropriate because: needs TEE  Planned Discharge Destination:  Home    Time spent: 20 minutes  Author: Brendia Sacks, MD 10/06/2021 4:41 PM  For on call review [email or web address].

## 2021-10-07 ENCOUNTER — Encounter (HOSPITAL_COMMUNITY): Payer: Self-pay | Admitting: Family Medicine

## 2021-10-07 ENCOUNTER — Inpatient Hospital Stay (HOSPITAL_COMMUNITY): Payer: Self-pay

## 2021-10-07 ENCOUNTER — Inpatient Hospital Stay (HOSPITAL_COMMUNITY): Payer: Self-pay | Admitting: Anesthesiology

## 2021-10-07 ENCOUNTER — Encounter (HOSPITAL_COMMUNITY)
Admission: EM | Disposition: A | Discharge status: Home / Self Care | Payer: Self-pay | Source: Home / Self Care | Attending: Family Medicine

## 2021-10-07 DIAGNOSIS — F17200 Nicotine dependence, unspecified, uncomplicated: Secondary | ICD-10-CM

## 2021-10-07 DIAGNOSIS — I10 Essential (primary) hypertension: Secondary | ICD-10-CM

## 2021-10-07 DIAGNOSIS — I5189 Other ill-defined heart diseases: Secondary | ICD-10-CM

## 2021-10-07 DIAGNOSIS — R55 Syncope and collapse: Secondary | ICD-10-CM

## 2021-10-07 DIAGNOSIS — D649 Anemia, unspecified: Secondary | ICD-10-CM

## 2021-10-07 HISTORY — PX: BUBBLE STUDY: SHX6837

## 2021-10-07 HISTORY — PX: TEE WITHOUT CARDIOVERSION: SHX5443

## 2021-10-07 SURGERY — ECHOCARDIOGRAM, TRANSESOPHAGEAL
Anesthesia: Monitor Anesthesia Care

## 2021-10-07 MED ORDER — SODIUM CHLORIDE 0.9 % IV SOLN: INTRAVENOUS | Status: DC

## 2021-10-07 MED ORDER — LIDOCAINE 2% (20 MG/ML) 5 ML SYRINGE
INTRAMUSCULAR | Status: DC | PRN
  Administered 2021-10-07: 50 mg via INTRAVENOUS

## 2021-10-07 MED ORDER — PROPOFOL 500 MG/50ML IV EMUL
INTRAVENOUS | Status: DC | PRN
  Administered 2021-10-07: 150 ug/kg/min via INTRAVENOUS
  Administered 2021-10-07 (×2): 30 mg via INTRAVENOUS
  Administered 2021-10-07: 20 mg via INTRAVENOUS

## 2021-10-07 NOTE — Anesthesia Postprocedure Evaluation (Signed)
Anesthesia Post Note  Patient: Cory Henderson  Procedure(s) Performed: TRANSESOPHAGEAL ECHOCARDIOGRAM (TEE) BUBBLE STUDY     Patient location during evaluation: Endoscopy Anesthesia Type: MAC Level of consciousness: awake and alert Pain management: pain level controlled Vital Signs Assessment: post-procedure vital signs reviewed and stable Respiratory status: spontaneous breathing, nonlabored ventilation, respiratory function stable and patient connected to nasal cannula oxygen Cardiovascular status: blood pressure returned to baseline and stable Postop Assessment: no apparent nausea or vomiting Anesthetic complications: no   No notable events documented.  Last Vitals:  Vitals:   10/07/21 1128 10/07/21 1140  BP: (!) 134/93 (!) 127/92  Pulse: 81 81  Resp: 12 18  Temp:    SpO2: 98% 99%    Last Pain:  Vitals:   10/07/21 1140  TempSrc:   PainSc: 0-No pain                 Shannen Flansburg DANIEL

## 2021-10-07 NOTE — Progress Notes (Signed)
TEE findings reviewed: Normal LV function; trace AI, MR and TR; prominent eustachian valve; no RA mass.   Will sign off.  Call with any questions.

## 2021-10-07 NOTE — Interval H&P Note (Signed)
History and Physical Interval Note:  10/07/2021 10:39 AM  Cory Henderson  has presented today for surgery, with the diagnosis of RIGHT ATRIAL MASS.  The various methods of treatment have been discussed with the patient and family. After consideration of risks, benefits and other options for treatment, the patient has consented to  Procedure(s): TRANSESOPHAGEAL ECHOCARDIOGRAM (TEE) (N/A) as a surgical intervention.  The patient's history has been reviewed, patient examined, no change in status, stable for surgery.  I have reviewed the patient's chart and labs.  Questions were answered to the patient's satisfaction.     Olga Millers

## 2021-10-07 NOTE — Progress Notes (Signed)
    Transesophageal Echocardiogram Note  Cory Henderson [id number] [birth year]  Procedure: Transesophageal Echocardiogram Indications: Possible RA mass  Procedure Details Consent: Obtained Time Out: Verified patient identification, verified procedure, site/side was marked, verified correct patient position, special equipment/implants available, Radiology Safety Procedures followed,  medications/allergies/relevent history reviewed, required imaging and test results available.  Performed  Medications:  Pt sedated by anesthesia with diprovan 350 mg IV total.  Normal LV function; trace AI, MR and TR; prominent eustachian valve; no RA mass.   Complications: No apparent complications Patient did tolerate procedure well.  Olga Millers, MD

## 2021-10-07 NOTE — Discharge Summary (Signed)
Physician Discharge Summary   Patient: Cory Henderson MRN: [id number] DOB: 16-Mar-1976  Admit date:     10/02/2021  Discharge date: 10/07/21  Discharge Physician: Brendia Sacks   PCP: Patient, No Pcp Per   Recommendations at discharge:   Syncope --No driving (patient does not drive), suggest outpatient follow-up with PCP and 1 time with neurology.  However given the lack of definitive history, no antiepileptic indicated.  Bipolar 1 disorder, self-reported schizophrenia --Given 2 episodes with increase in Seroquel in the last 6 weeks, he needs close outpatient follow-up with his psychiatrist.  I would recommend stopping Ambien at this time.  Would consider decreasing Seroquel dose.  Discharge Diagnoses: Principal Problem:   Syncope Active Problems:   Hypotension   Bipolar 1 disorder (HCC)   Essential hypertension   Dyslipidemia   Schizophrenia (HCC)   AKI (acute kidney injury) (HCC)  Resolved Problems:   * No resolved hospital problems. *  Hospital Course: 46 year old man PMH bipolar and schizophrenia presented after loss of consciousness at home.  Found to have acute kidney injury, admitted for further evaluation.  Extensive discussion with sister provided further history: 2 episodes of loss of consciousness/syncope this week, both occurring after Seroquel and Ambien, the first time also in the context of some tequila.  No shaking or seizure-like activity, first event occurred while sitting on the couch, second while getting up and moving and recurred several times.  In about the last 6 weeks Seroquel dose was increased and was allowed to take 2 Ambien as needed.  No previous history of seizures.  Echocardiogram suggested the possibility of myxoma or small thrombus right atrium.  TEE ruled out thrombus or mass.  Patient cleared for discharge.  Individual issues as below.  Syncope --2 episodes in the last week, both occurring at night after taking Seroquel and Ambien, the  first occurring also after drinking some tequila.  Also in context of acute kidney injury and probably poor oral intake. By history both seem to be syncopal in description without reported seizure-like activity.   --EEG negative and CT head unremarkable.  Telemetry sinus rhythm, EKG nonacute. --While atypical seizure is in the differential this seems unlikely based on his history.  Favor combination of medication superimposed on acute kidney injury and poor oral intake --No objective findings to suggest cardiogenic in nature and TEE was reassuring --No driving (patient does not drive), suggest outpatient follow-up with PCP and 1 time with neurology.  However given the lack of definitive history, no antiepileptic indicated.   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 --TEE ruled out mass   Acute hypotension --Resolved.  Probably secondary to poor oral hydration   Acute kidney injury --Resolved rapidly with IV fluids.  May have CKD stage II, cannot definitively say at this time.   Bipolar 1 disorder, self-reported schizophrenia --Given 2 episodes with increase in Seroquel in the last 6 weeks, he needs close outpatient follow-up with his psychiatrist.  I would recommend stopping Ambien at this time.  Would consider decreasing Seroquel dose. --no history from patient or sister to suggest overdose, accidental or otherwise. Overdose ruled out.   Possible alcohol abuse --Both patient and sister deny this.  No signs or symptoms of withdrawal.  Agitated saline contrast bubble study was positive with shunting  observed after >6 cardiac cycles suggestive of intrapulmonary shunting.  --Discussed with pulmonology.  No hypoxia or symptoms, therefore not thought to be clinically significant.     Consultants:  Cardiology  Procedures performed:  TEE  IMPRESSIONS     1. Left ventricular ejection fraction, by estimation, is 55 to 60%. The  left ventricle has normal function. The  left ventricle has no regional  wall motion abnormalities.   2. Right ventricular systolic function is normal. The right ventricular  size is normal.   3. No left atrial/left atrial appendage thrombus was detected.   4. The mitral valve is normal in structure. Trivial mitral valve  regurgitation.   5. The aortic valve is tricuspid. Aortic valve regurgitation is trivial.   6. There is borderline dilatation of the aortic root, measuring 39 mm.   7. Agitated saline contrast bubble study was positive with shunting  observed after >6 cardiac cycles suggestive of intrapulmonary shunting.   Disposition: Home Diet recommendation:  Regular diet DISCHARGE MEDICATION: Allergies as of 10/07/2021       Reactions   Fish Allergy Anaphylaxis, Hives, Itching   Other Rash, Other (See Comments)   Skin Bleaching-sunscreen   Stearyl Alcohol Rash   Itching        Medication List     STOP taking these medications    omeprazole 20 MG capsule Commonly known as: PRILOSEC       TAKE these medications    amLODipine 10 MG tablet Commonly known as: NORVASC Take 10 mg by mouth daily.   aspirin EC 81 MG tablet Take 81 mg by mouth daily as needed (pain). Swallow whole.   atorvastatin 40 MG tablet Commonly known as: LIPITOR Take 40 mg by mouth daily.   hydrOXYzine 50 MG tablet Commonly known as: ATARAX Take 50 mg by mouth at bedtime.   MULTIVITAMIN PO Take 1 tablet by mouth daily in the afternoon.   QUEtiapine 400 MG tablet Commonly known as: SEROQUEL Take 400 mg by mouth at bedtime.      Recommend stopping Ambien  Feels good  Discharge Exam: Filed Weights   10/03/21 0432 10/07/21 1031  Weight: 89.8 kg 89.8 kg   Physical Exam Vitals reviewed.  Constitutional:      General: He is not in acute distress. Cardiovascular:     Rate and Rhythm: Normal rate and regular rhythm.     Heart sounds: No murmur heard. Pulmonary:     Effort: Pulmonary effort is normal. No respiratory  distress.     Breath sounds: No wheezing, rhonchi or rales.  Psychiatric:        Behavior: Behavior normal.       Condition at discharge: good  The results of significant diagnostics from this hospitalization (including imaging, microbiology, ancillary and laboratory) are listed below for reference.   Imaging Studies: ECHO TEE  Result Date: 10/07/2021    TRANSESOPHOGEAL ECHO REPORT   Patient Name:   Cory Henderson Date of Exam: 10/07/2021 Medical Rec #:  [id number]            Height:       72.0 in Accession #:    [id number]           Weight:       198.0 lb Date of Birth:  Oct 17, 1975           BSA:          2.121 m Patient Age:    45 years             BP:           137/108 mmHg Patient Gender: M  HR:           100 bpm. Exam Location:  Inpatient Procedure: Transesophageal Echo, Color Doppler, Cardiac Doppler and Saline            Contrast Bubble Study Indications:     RA Mass  History:         Patient has prior history of Echocardiogram examinations, most                  recent 10/03/2021. Risk Factors:Hypertension and Dyslipidemia.  Sonographer:     Raquel Sarna Senior RDCS Referring Phys:  Charlie Pitter Diagnosing Phys: Kirk Ruths MD PROCEDURE: After discussion of the risks and benefits of a TEE, an informed consent was obtained from the patient. The transesophogeal probe was passed without difficulty through the esophogus of the patient. Local oropharyngeal anesthetic was provided with Cetacaine. Sedation performed by different physician. The patient was monitored while under deep sedation. Anesthestetic sedation was provided intravenously by Anesthesiology: 342mg  of Propofol, 50mg  of Lidocaine. The patient developed no complications during the procedure. IMPRESSIONS  1. Left ventricular ejection fraction, by estimation, is 55 to 60%. The left ventricle has normal function. The left ventricle has no regional wall motion abnormalities.  2. Right ventricular systolic function is  normal. The right ventricular size is normal.  3. No left atrial/left atrial appendage thrombus was detected.  4. The mitral valve is normal in structure. Trivial mitral valve regurgitation.  5. The aortic valve is tricuspid. Aortic valve regurgitation is trivial.  6. There is borderline dilatation of the aortic root, measuring 39 mm.  7. Agitated saline contrast bubble study was positive with shunting observed after >6 cardiac cycles suggestive of intrapulmonary shunting. FINDINGS  Left Ventricle: Left ventricular ejection fraction, by estimation, is 55 to 60%. The left ventricle has normal function. The left ventricle has no regional wall motion abnormalities. The left ventricular internal cavity size was normal in size. Right Ventricle: The right ventricular size is normal. Right ventricular systolic function is normal. Left Atrium: Left atrial size was normal in size. No left atrial/left atrial appendage thrombus was detected. Right Atrium: Right atrial size was normal in size. Prominent Eustachian valve. Pericardium: There is no evidence of pericardial effusion. Mitral Valve: The mitral valve is normal in structure. Trivial mitral valve regurgitation. Tricuspid Valve: The tricuspid valve is normal in structure. Tricuspid valve regurgitation is trivial. Aortic Valve: The aortic valve is tricuspid. Aortic valve regurgitation is trivial. Pulmonic Valve: The pulmonic valve was grossly normal. Pulmonic valve regurgitation is not visualized. Aorta: The aortic root is normal in size and structure. There is borderline dilatation of the aortic root, measuring 39 mm. There is minimal (Grade I) plaque involving the descending aorta. IAS/Shunts: No atrial level shunt detected by color flow Doppler. Agitated saline contrast was given intravenously to evaluate for intracardiac shunting. Agitated saline contrast bubble study was positive with shunting observed after >6 cardiac cycles suggestive of intrapulmonary shunting.  Kirk Ruths MD Electronically signed by Kirk Ruths MD Signature Date/Time: 10/07/2021/11:51:10 AM    Final    VAS [place] LOWER EXTREMITY VENOUS (DVT)  Result Date: 10/06/2021  Lower Venous DVT Study Patient Name:  Cory Henderson  Date of Exam:   10/05/2021 Medical Rec #: [id number]             Accession #:    [id number] Date of Birth: Jul 22, 1975            Patient Gender: M Patient Age:   68 years  Exam Location:  [facility] Procedure:      VAS [place] LOWER EXTREMITY VENOUS (DVT) Referring Phys: Murray Hodgkins --------------------------------------------------------------------------------  Indications: Possible right atrial thrombus.  Comparison Study: No prior study on file Performing Technologist: Sharion Dove RVS  Examination Guidelines: A complete evaluation includes B-mode imaging, spectral Doppler, color Doppler, and power Doppler as needed of all accessible portions of each vessel. Bilateral testing is considered an integral part of a complete examination. Limited examinations for reoccurring indications may be performed as noted. The reflux portion of the exam is performed with the patient in reverse Trendelenburg.  +---------+---------------+---------+-----------+----------+--------------+ RIGHT    CompressibilityPhasicitySpontaneityPropertiesThrombus Aging +---------+---------------+---------+-----------+----------+--------------+ CFV      Full           Yes      Yes                                 +---------+---------------+---------+-----------+----------+--------------+ SFJ      Full                                                        +---------+---------------+---------+-----------+----------+--------------+ FV Prox  Full                                                        +---------+---------------+---------+-----------+----------+--------------+ FV Mid   Full                                                         +---------+---------------+---------+-----------+----------+--------------+ FV DistalFull                                                        +---------+---------------+---------+-----------+----------+--------------+ PFV      Full                                                        +---------+---------------+---------+-----------+----------+--------------+ POP      Full           Yes      Yes                                 +---------+---------------+---------+-----------+----------+--------------+ PTV      Full                                                        +---------+---------------+---------+-----------+----------+--------------+ PERO     Full                                                        +---------+---------------+---------+-----------+----------+--------------+   +---------+---------------+---------+-----------+----------+--------------+  LEFT     CompressibilityPhasicitySpontaneityPropertiesThrombus Aging +---------+---------------+---------+-----------+----------+--------------+ CFV      Full           Yes      Yes                                 +---------+---------------+---------+-----------+----------+--------------+ SFJ      Full                                                        +---------+---------------+---------+-----------+----------+--------------+ FV Prox  Full                                                        +---------+---------------+---------+-----------+----------+--------------+ FV Mid   Full                                                        +---------+---------------+---------+-----------+----------+--------------+ FV DistalFull                                                        +---------+---------------+---------+-----------+----------+--------------+ PFV      Full                                                         +---------+---------------+---------+-----------+----------+--------------+ POP      Full           Yes      Yes                                 +---------+---------------+---------+-----------+----------+--------------+ PTV      Full                                                        +---------+---------------+---------+-----------+----------+--------------+ PERO     Full                                                        +---------+---------------+---------+-----------+----------+--------------+     Summary: BILATERAL: - No evidence of deep vein thrombosis seen in the lower extremities, bilaterally. -  LEFT: - There is no evidence of deep vein thrombosis in the lower extremity.  - A cystic structure is found in the popliteal fossa measuring  1.03 cm X 1.73 cm.  *See table(s) above for measurements and observations. Electronically signed by Orlie Pollen on 10/06/2021 at 11:14:38 AM.    Final    EEG adult  Result Date: 10/04/2021 Lora Havens, MD     10/04/2021  8:16 AM Patient Name: Cory Henderson MRN: [id number] Epilepsy Attending: Lora Havens Referring Physician/Provider: Samuella Cota, MD Date: 10/03/2021 Duration: 21.19 mins Patient history: 46 year old male with altered mental status and seizure-like activity.  EEG to evaluate for seizure. Level of alertness: Awake AEDs during EEG study: None Technical aspects: This EEG study was done with scalp electrodes positioned according to the 10-20 International system of electrode placement. Electrical activity was acquired at a sampling rate of 500Hz  and reviewed with a high frequency filter of 70Hz  and a low frequency filter of 1Hz . EEG data were recorded continuously and digitally stored. Description: The posterior dominant rhythm consists 10 Hz activity of moderate voltage (25-35 uV) seen predominantly in posterior head regions, symmetric and reactive to eye opening and eye closing. Hyperventilation and photic  stimulation were not performed.   IMPRESSION: This study is within normal limits. No seizures or epileptiform discharges were seen throughout the recording. Lora Havens   ECHOCARDIOGRAM COMPLETE  Result Date: 10/04/2021    ECHOCARDIOGRAM REPORT   Patient Name:   Cory Henderson Date of Exam: 10/03/2021 Medical Rec #:  [id number]            Height:       72.0 in Accession #:    [id number]           Weight:       198.0 lb Date of Birth:  04-20-1975           BSA:          2.121 m Patient Age:    56 years             BP:           108/71 mmHg Patient Gender: M                    HR:           68 bpm. Exam Location:  Inpatient Procedure: 2D Echo, Cardiac Doppler and Color Doppler Indications:    Syncope  History:        Patient has no prior history of Echocardiogram examinations.                 Risk Factors:Hypertension, Current Smoker and Dyslipidemia.  Sonographer:    Joette Catching RCS Referring Phys: [place]  1. Left ventricular ejection fraction, by estimation, is 55 to 60%. The left ventricle has normal function. The left ventricle has no regional wall motion abnormalities. There is mild concentric left ventricular hypertrophy. Left ventricular diastolic parameters are consistent with Grade I diastolic dysfunction (impaired relaxation).  2. Right ventricular systolic function is normal. The right ventricular size is normal. There is normal pulmonary artery systolic pressure.  3. Hyperechoic mobile density on the right atrial side of the interatrial septum. Measure 1.01 x 0.36 cm. This may be a small myxoma or thrombus. Consider cardiac CT or TEE to better evaluate.  4. The mitral valve is normal in structure. No evidence of mitral valve regurgitation. No evidence of mitral stenosis.  5. The aortic valve is tricuspid. Aortic valve regurgitation is trivial. No aortic stenosis is present.  6. Aortic dilatation noted. There is mild dilatation  of the aortic root, measuring 39  mm.  7. The inferior vena cava is normal in size with greater than 50% respiratory variability, suggesting right atrial pressure of 3 mmHg. FINDINGS  Left Ventricle: Left ventricular ejection fraction, by estimation, is 55 to 60%. The left ventricle has normal function. The left ventricle has no regional wall motion abnormalities. The left ventricular internal cavity size was normal in size. There is  mild concentric left ventricular hypertrophy. Left ventricular diastolic parameters are consistent with Grade I diastolic dysfunction (impaired relaxation). Indeterminate filling pressures. Right Ventricle: The right ventricular size is normal. No increase in right ventricular wall thickness. Right ventricular systolic function is normal. There is normal pulmonary artery systolic pressure. The tricuspid regurgitant velocity is 2.62 m/s, and  with an assumed right atrial pressure of 3 mmHg, the estimated right ventricular systolic pressure is [id number] mmHg. Left Atrium: Left atrial size was normal in size. Right Atrium: Right atrial size was normal in size. Pericardium: There is no evidence of pericardial effusion. Mitral Valve: The mitral valve is normal in structure. No evidence of mitral valve regurgitation. No evidence of mitral valve stenosis. Tricuspid Valve: The tricuspid valve is normal in structure. Tricuspid valve regurgitation is trivial. No evidence of tricuspid stenosis. Aortic Valve: The aortic valve is tricuspid. Aortic valve regurgitation is trivial. No aortic stenosis is present. Aortic valve mean gradient measures 7.0 mmHg. Aortic valve peak gradient measures 13.7 mmHg. Aortic valve area, by VTI measures 2.63 cm. Pulmonic Valve: The pulmonic valve was normal in structure. Pulmonic valve regurgitation is not visualized. No evidence of pulmonic stenosis. Aorta: Aortic dilatation noted. There is mild dilatation of the aortic root, measuring 39 mm. Venous: The inferior vena cava is normal in size with greater  than 50% respiratory variability, suggesting right atrial pressure of 3 mmHg. IAS/Shunts: No atrial level shunt detected by color flow Doppler.  LEFT VENTRICLE PLAX 2D LVIDd:         4.40 cm      Diastology LVIDs:         2.90 cm      LV e' medial:    11.20 cm/s LV PW:         1.10 cm      LV E/e' medial:  8.6 LV IVS:        1.10 cm      LV e' lateral:   9.90 cm/s LVOT diam:     2.00 cm      LV E/e' lateral: 9.7 LV SV:         95 LV SV Index:   45 LVOT Area:     3.14 cm  LV Volumes (MOD) LV vol d, MOD A2C: 76.6 ml LV vol d, MOD A4C: 123.0 ml LV vol s, MOD A2C: 29.1 ml LV vol s, MOD A4C: 53.7 ml LV SV MOD A2C:     47.5 ml LV SV MOD A4C:     123.0 ml LV SV MOD BP:      64.5 ml RIGHT VENTRICLE             IVC RV Basal diam:  3.90 cm     IVC diam: 1.60 cm RV Mid diam:    2.30 cm RV S prime:     19.10 cm/s TAPSE (M-mode): 3.1 cm LEFT ATRIUM             Index        RIGHT ATRIUM  Index LA diam:        2.40 cm 1.13 cm/m   RA Area:     16.70 cm LA Vol (A2C):   33.5 ml 15.79 ml/m  RA Volume:   39.20 ml  18.48 ml/m LA Vol (A4C):   26.8 ml 12.63 ml/m LA Biplane Vol: 29.0 ml 13.67 ml/m  AORTIC VALVE                     PULMONIC VALVE AV Area (Vmax):    2.51 cm      PV Vmax:       1.00 m/s AV Area (Vmean):   2.70 cm      PV Peak grad:  4.0 mmHg AV Area (VTI):     2.63 cm AV Vmax:           185.00 cm/s AV Vmean:          127.000 cm/s AV VTI:            0.359 m AV Peak Grad:      13.7 mmHg AV Mean Grad:      7.0 mmHg LVOT Vmax:         148.00 cm/s LVOT Vmean:        109.000 cm/s LVOT VTI:          0.301 m LVOT/AV VTI ratio: 0.84  AORTA Ao Root diam: 3.90 cm Ao Asc diam:  3.20 cm MITRAL VALVE                TRICUSPID VALVE MV Area (PHT): 3.56 cm     TR Peak grad:   27.5 mmHg MV Decel Time: 213 msec     TR Vmax:        262.00 cm/s MV E velocity: 96.40 cm/s MV A velocity: 103.00 cm/s  SHUNTS MV E/A ratio:  0.94         Systemic VTI:  0.30 m                             Systemic Diam: 2.00 cm Skeet Latch MD  Electronically signed by Skeet Latch MD Signature Date/Time: 10/04/2021/6:04:21 AM    Final    US RENAL  Result Date: 10/03/2021 CLINICAL DATA:  Acute kidney injury EXAM: RENAL / URINARY TRACT ULTRASOUND COMPLETE COMPARISON:  None Available. FINDINGS: Right Kidney: Renal measurements: 12.5 x 4.4 x 5.6 cm = volume: 160 mL. Echogenicity within normal limits. No mass or hydronephrosis visualized. Left Kidney: Renal measurements: 11.8 x 4.9 x 6.4 cm = volume: 190 mL. Echogenicity within normal limits. No mass or hydronephrosis visualized. Bladder: Appears normal for degree of bladder distention. Other: None. IMPRESSION: No ultrasound abnormality of the kidneys.  No hydronephrosis. Electronically Signed   By: Delanna Ahmadi M.D.   On: 10/03/2021 14:02   DG Shoulder Right  Result Date: 10/03/2021 CLINICAL DATA:  Provided history: Right shoulder pain, unspecified chronicity. EXAM: RIGHT SHOULDER - 2+ VIEW COMPARISON:  No pertinent prior exams available for comparison. FINDINGS: There is normal bony alignment. No evidence of acute osseous or articular abnormality. The joint spaces are maintained. IMPRESSION: No evidence of acute osseous or articular abnormality. Electronically Signed   By: Kellie Simmering D.O.   On: 10/03/2021 09:21   CT Head Wo Contrast  Result Date: 10/03/2021 CLINICAL DATA:  Mental status change, unknown cause. EXAM: CT HEAD WITHOUT CONTRAST TECHNIQUE: Contiguous axial images were obtained from the base of  the skull through the vertex without intravenous contrast. RADIATION DOSE REDUCTION: This exam was performed according to the departmental dose-optimization program which includes automated exposure control, adjustment of the mA and/or kV according to patient size and/or use of iterative reconstruction technique. COMPARISON:  10/30/2019. FINDINGS: Brain: No acute intracranial hemorrhage, midline shift or mass effect. No extra-axial fluid collection. Gray-white matter differentiation is  within normal limits. No hydrocephalus. Vascular: No hyperdense vessel or unexpected calcification. Skull: Normal. Negative for fracture or focal lesion. Sinuses/Orbits: No acute finding. Other: None. IMPRESSION: No acute intracranial process. Electronically Signed   By: Thornell Sartorius M.D.   On: 10/03/2021 03:31   DG Chest Port 1 View  Result Date: 10/03/2021 CLINICAL DATA:  Syncope. EXAM: PORTABLE CHEST 1 VIEW COMPARISON:  Chest radiograph dated 10/30/2019. FINDINGS: No focal consolidation, pleural effusion or pneumothorax. The cardiac silhouette is within normal limits. No acute osseous pathology. IMPRESSION: No active disease. Electronically Signed   By: Elgie Collard M.D.   On: 10/03/2021 00:12    Microbiology: Results for orders placed or performed during the hospital encounter of 09/02/18  Group A Strep by PCR     Status: None   Collection Time: 09/02/18 11:31 AM   Specimen: Throat; Sterile Swab  Result Value Ref Range Status   Group A Strep by PCR NOT DETECTED NOT DETECTED Final    Comment: Performed at [facility], [address] [address], [place], [place] [address]  Novel Coronavirus,NAA,(SEND-OUT TO REF LAB - TAT 24-48 hrs); Hosp Order     Status: None   Collection Time: 09/02/18  1:15 PM   Specimen: Nasopharyngeal Swab; Respiratory  Result Value Ref Range Status   SARS-CoV-2, NAA NOT DETECTED NOT DETECTED Final    Comment: (NOTE) This test was developed and its performance characteristics determined by [employer]. This test has not been FDA cleared or approved. This test has been authorized by FDA under an Emergency Use Authorization (EUA). This test is only authorized for the duration of time the declaration that circumstances exist justifying the authorization of the emergency use of in vitro diagnostic tests for detection of SARS-CoV-2 virus and/or diagnosis of COVID-19 infection under section 564(b)(1) of the Act, 21 U.S.C. 832NVB-1(Y)(6), unless the  authorization is terminated or revoked sooner. When diagnostic testing is negative, the possibility of a false negative result should be considered in the context of a patient's recent exposures and the presence of clinical signs and symptoms consistent with COVID-19. An individual without symptoms of COVID-19 and who is not shedding SARS-CoV-2 virus would expect to have a negative (not detected) result in this assay. Performed  At: [facility] [address] [place], [place] [id number] Jolene Schimke MD FS:[id number]    Coronavirus Source NASOPHARYNGEAL  Final    Comment: Performed at [facility], [address], [place], [place] [address]    Labs: CBC: Recent Labs  Lab 10/03/21 0007 10/03/21 0033 10/03/21 1025 10/04/21 0521  WBC 14.9*  --  10.7* 8.3  NEUTROABS 12.1*  --  7.1  --   HGB 13.8 13.9 12.5* 12.1*  HCT 40.4 41.0 37.2* 35.9*  MCV 88.0  --  90.3 87.8  PLT 265  --  259 238   Basic Metabolic Panel: Recent Labs  Lab 10/03/21 0007 10/03/21 0033 10/03/21 1025 10/04/21 0521 10/05/21 0623  NA 143 142 142 142 136  K 4.1 4.0 4.1 4.2 3.6  CL 112* 106 115* 107 103  CO2 21*  --  24  26 25  GLUCOSE 126* 122* 87 92 95  BUN 20 19 14 15 11   CREATININE 2.69* 2.90* 1.53* 1.34* 1.27*  CALCIUM 8.2*  --  7.8* 8.6* 8.2*  MG  --   --  2.2  --   --    Liver Function Tests: Recent Labs  Lab 10/03/21 0007  AST 20  ALT 21  ALKPHOS 54  BILITOT 0.6  PROT 5.7*  ALBUMIN 3.6   CBG: Recent Labs  Lab 10/03/21 0012  GLUCAP 121*    Discharge time spent: greater than 30 minutes.  Signed: Murray Hodgkins, MD [facility] 10/07/2021

## 2021-10-07 NOTE — Anesthesia Preprocedure Evaluation (Addendum)
Anesthesia Evaluation  Patient identified by MRN, date of birth, ID band Patient awake    Reviewed: Allergy & Precautions, NPO status , Patient's Chart, lab work & pertinent test results  History of Anesthesia Complications Negative for: history of anesthetic complications  Airway Mallampati: I  TM Distance: >3 FB Neck ROM: Full    Dental no notable dental hx. (+) Missing, Poor Dentition, Dental Advisory Given   Pulmonary Current Smoker and Patient abstained from smoking.,    Pulmonary exam normal        Cardiovascular hypertension, Pt. on medications Normal cardiovascular exam  Rt atrial mass  IMPRESSIONS    1. Left ventricular ejection fraction, by estimation, is 55 to 60%. The  left ventricle has normal function. The left ventricle has no regional  wall motion abnormalities. There is mild concentric left ventricular  hypertrophy. Left ventricular diastolic  parameters are consistent with Grade I diastolic dysfunction (impaired  relaxation).  2. Right ventricular systolic function is normal. The right ventricular  size is normal. There is normal pulmonary artery systolic pressure.  3. Hyperechoic mobile density on the right atrial side of the interatrial  septum. Measure 1.01 x 0.36 cm. This may be a small myxoma or thrombus.  Consider cardiac CT or TEE to better evaluate.  4. The mitral valve is normal in structure. No evidence of mitral valve  regurgitation. No evidence of mitral stenosis.  5. The aortic valve is tricuspid. Aortic valve regurgitation is trivial.  No aortic stenosis is present.  6. Aortic dilatation noted. There is mild dilatation of the aortic root,  measuring 39 mm.  7. The inferior vena cava is normal in size with greater than 50%  respiratory variability, suggesting right atrial pressure of 3 mmHg.    Neuro/Psych PSYCHIATRIC DISORDERS Anxiety Bipolar Disorder Schizophrenia negative  neurological ROS     GI/Hepatic negative GI ROS, Neg liver ROS,   Endo/Other  negative endocrine ROS  Renal/GU Renal InsufficiencyRenal disease     Musculoskeletal negative musculoskeletal ROS (+)   Abdominal   Peds  Hematology  (+) Blood dyscrasia, anemia ,   Anesthesia Other Findings   Reproductive/Obstetrics                            Anesthesia Physical Anesthesia Plan  ASA: 3  Anesthesia Plan: MAC   Post-op Pain Management: Minimal or no pain anticipated   Induction:   PONV Risk Score and Plan: 0 and Propofol infusion  Airway Management Planned: Natural Airway, Simple Face Mask and Nasal Cannula  Additional Equipment:   Intra-op Plan:   Post-operative Plan:   Informed Consent: I have reviewed the patients History and Physical, chart, labs and discussed the procedure including the risks, benefits and alternatives for the proposed anesthesia with the patient or authorized representative who has indicated his/her understanding and acceptance.     Dental advisory given  Plan Discussed with: Anesthesiologist and CRNA  Anesthesia Plan Comments:        Anesthesia Quick Evaluation

## 2021-10-07 NOTE — TOC Initial Note (Signed)
Transition of Care [facility]) - Initial/Assessment Note    Patient Details  Name: Cory Henderson MRN: [id number] Date of Birth: [birth year]  Transition of Care [facility]) CM/SW Contact:    Durenda Guthrie, RN Phone Number: 10/07/2021, 9:29 AM  Clinical Narrative:                 Transition of Care Screening Note:   Transition of Care Department [facility]) has reviewed patient and no TOC needs have been identified at this time. We will continue to monitor patient advancement through Interdisciplinary progressions. If new patient transition needs arise, please place a consult.       Patient Goals and CMS Choice        Expected Discharge Plan and Services                                                Prior Living Arrangements/Services                       Activities of Daily Living Home Assistive Devices/Equipment: None ADL Screening (condition at time of admission) Patient's cognitive ability adequate to safely complete daily activities?: Yes Is the patient deaf or have difficulty hearing?: No Does the patient have difficulty seeing, even when wearing glasses/contacts?: No Does the patient have difficulty concentrating, remembering, or making decisions?: No Patient able to express need for assistance with ADLs?: Yes Does the patient have difficulty dressing or bathing?: No Independently performs ADLs?: Yes (appropriate for developmental age) Does the patient have difficulty walking or climbing stairs?: No Weakness of Legs: None Weakness of Arms/Hands: None  Permission Sought/Granted                  Emotional Assessment              Admission diagnosis:  Syncope and collapse [R55] Dehydration [E86.0] Syncope [R55] AKI (acute kidney injury) (HCC) [N17.9] Overdose of antipsychotic [T43.501A] Hypotension [I95.9] Patient Active Problem List   Diagnosis Date Noted   Hypotension 10/03/2021   Overdose of antipsychotic 10/03/2021   Syncope  10/03/2021   AKI (acute kidney injury) (HCC) 10/03/2021   Bipolar 1 disorder (HCC)    Essential hypertension    Dyslipidemia    Schizophrenia (HCC)    PCP:  Patient, No Pcp Per Pharmacy:   [id number] [place], [place] - [address] AT [facility] [address] [place] [address] Phone: [phone] Fax: [phone]  [facility] [address] [place]), [place] - [address] [place]) [place] [address] Phone: [phone] Fax: [phone]     Social Determinants of Health (SDOH) Interventions    Readmission Risk Interventions     No data to display

## 2021-10-07 NOTE — Transfer of Care (Signed)
Immediate Anesthesia Transfer of Care Note  Patient: Cory Henderson  Procedure(s) Performed: TRANSESOPHAGEAL ECHOCARDIOGRAM (TEE) BUBBLE STUDY  Patient Location: Endoscopy Unit  Anesthesia Type:MAC  Level of Consciousness: awake, alert  and oriented  Airway & Oxygen Therapy: Patient Spontanous Breathing  Post-op Assessment: Report given to RN  Post vital signs: Reviewed and stable  Last Vitals:  Vitals Value Taken Time  BP 89/78 10/07/21 1120  Temp    Pulse 100 10/07/21 1121  Resp 24 10/07/21 1121  SpO2 95 % 10/07/21 1121  Vitals shown include unvalidated device data.  Last Pain:  Vitals:   10/07/21 1031  TempSrc: Temporal  PainSc: 0-No pain      Patients Stated Pain Goal: 0 (99/14/44 5848)  Complications: No notable events documented.

## 2021-10-08 ENCOUNTER — Encounter (HOSPITAL_COMMUNITY): Payer: Self-pay | Admitting: Cardiology

## 2021-12-25 ENCOUNTER — Ambulatory Visit
Admission: RE | Admit: 2021-12-25 | Discharge: 2021-12-25 | Disposition: A | Discharge status: Home / Self Care | Payer: 59 | Source: Ambulatory Visit | Attending: Family Medicine | Admitting: Family Medicine

## 2021-12-25 ENCOUNTER — Other Ambulatory Visit: Payer: Self-pay | Admitting: Family Medicine

## 2021-12-25 DIAGNOSIS — R2241 Localized swelling, mass and lump, right lower limb: Secondary | ICD-10-CM

## 2022-03-26 ENCOUNTER — Encounter (HOSPITAL_BASED_OUTPATIENT_CLINIC_OR_DEPARTMENT_OTHER): Payer: Self-pay

## 2022-03-26 DIAGNOSIS — R0683 Snoring: Secondary | ICD-10-CM

## 2022-03-26 DIAGNOSIS — G4710 Hypersomnia, unspecified: Secondary | ICD-10-CM

## 2022-03-26 DIAGNOSIS — R5383 Other fatigue: Secondary | ICD-10-CM

## 2022-03-26 DIAGNOSIS — G4709 Other insomnia: Secondary | ICD-10-CM

## 2022-10-29 ENCOUNTER — Other Ambulatory Visit: Payer: Self-pay

## 2022-10-29 ENCOUNTER — Emergency Department (HOSPITAL_COMMUNITY)
Admission: EM | Admit: 2022-10-29 | Discharge: 2022-10-30 | Disposition: A | Discharge status: Home / Self Care | Payer: Commercial Managed Care - HMO | Attending: Emergency Medicine | Admitting: Emergency Medicine

## 2022-10-29 ENCOUNTER — Emergency Department (HOSPITAL_COMMUNITY): Payer: Commercial Managed Care - HMO

## 2022-10-29 ENCOUNTER — Encounter (HOSPITAL_COMMUNITY): Payer: Self-pay

## 2022-10-29 DIAGNOSIS — Z7982 Long term (current) use of aspirin: Secondary | ICD-10-CM | POA: Insufficient documentation

## 2022-10-29 DIAGNOSIS — R202 Paresthesia of skin: Secondary | ICD-10-CM | POA: Insufficient documentation

## 2022-10-29 DIAGNOSIS — I440 Atrioventricular block, first degree: Secondary | ICD-10-CM | POA: Insufficient documentation

## 2022-10-29 DIAGNOSIS — Z79899 Other long term (current) drug therapy: Secondary | ICD-10-CM | POA: Insufficient documentation

## 2022-10-29 DIAGNOSIS — R001 Bradycardia, unspecified: Secondary | ICD-10-CM | POA: Diagnosis not present

## 2022-10-29 DIAGNOSIS — I10 Essential (primary) hypertension: Secondary | ICD-10-CM | POA: Insufficient documentation

## 2022-10-29 DIAGNOSIS — R531 Weakness: Secondary | ICD-10-CM | POA: Diagnosis not present

## 2022-10-29 DIAGNOSIS — M542 Cervicalgia: Secondary | ICD-10-CM | POA: Insufficient documentation

## 2022-10-29 DIAGNOSIS — R200 Anesthesia of skin: Secondary | ICD-10-CM | POA: Diagnosis present

## 2022-10-29 DIAGNOSIS — W19XXXS Unspecified fall, sequela: Secondary | ICD-10-CM | POA: Insufficient documentation

## 2022-10-29 DIAGNOSIS — Y92009 Unspecified place in unspecified non-institutional (private) residence as the place of occurrence of the external cause: Secondary | ICD-10-CM

## 2022-10-29 LAB — BASIC METABOLIC PANEL
Anion gap: 8 (ref 5–15)
BUN: 13 mg/dL (ref 6–20)
CO2: 25 mmol/L (ref 22–32)
Calcium: 8.9 mg/dL (ref 8.9–10.3)
Chloride: 103 mmol/L (ref 98–111)
Creatinine, Ser: 1.04 mg/dL (ref 0.61–1.24)
GFR, Estimated: >60 mL/min (ref >60)
Glucose, Bld: 92 mg/dL (ref 70–99)
Potassium: 4 mmol/L (ref 3.5–5.1)
Sodium: 136 mmol/L (ref 135–145)

## 2022-10-29 LAB — CBC
HCT: 40.4 % (ref 39.0–52.0)
Hemoglobin: 13.4 g/dL (ref 13.0–17.0)
MCH: 29.6 pg (ref 26.0–34.0)
MCHC: 33.2 g/dL (ref 30.0–36.0)
MCV: 89.2 fL (ref 80.0–100.0)
Platelets: 302 10*3/uL (ref 150–400)
RBC: 4.53 MIL/uL (ref 4.22–5.81)
RDW: 13.2 % (ref 11.5–15.5)
WBC: 6.6 10*3/uL (ref 4.0–10.5)
nRBC: 0 % (ref 0.0–0.2)

## 2022-10-29 LAB — URINALYSIS, ROUTINE W REFLEX MICROSCOPIC
Bacteria, UA: NONE SEEN
Bilirubin Urine: NEGATIVE
Glucose, UA: NEGATIVE mg/dL
Ketones, ur: NEGATIVE mg/dL
Leukocytes,Ua: NEGATIVE
Nitrite: NEGATIVE
Protein, ur: NEGATIVE mg/dL
Specific Gravity, Urine: 1.018 (ref 1.005–1.030)
pH: 6 (ref 5.0–8.0)

## 2022-10-29 MED ORDER — ACETAMINOPHEN 500 MG PO TABS
1000.0000 mg | ORAL_TABLET | Freq: Once | ORAL | Status: AC
  Administered 2022-10-29: 1000 mg via ORAL
  Filled 2022-10-29: qty 2

## 2022-10-29 NOTE — ED Triage Notes (Signed)
Pt c/o bilateral shoulder pain and r arm numbness, bilateral knee pain x 2 weeks; denies known injury; was told recently he "might be developing lupus"; weakness and drift noted R arm; pt states "my shoulder hurts and it feel like its pulling my arm down"

## 2022-10-29 NOTE — Discharge Instructions (Addendum)
Thank you for coming to [facility] Emergency Department. You were seen for pain, numbness/tingling, and weakness in your neck and right arm. We did an exam, labs, and imaging, and these showed no acute findings. This is concerning for a possible peripheral neuropathy. We recommend following up with neurology. You can follow up with [facility]. You can call them in the morning to make an appointment.  Please follow up with your primary care provider within 1 week for your other concerns.  Do not hesitate to return to the ED or call 911 if you experience: -Worsening symptoms -Facial droop, slurred speech, new asymmetric weakness or numbness/tingling -Lightheadedness, passing out -Fevers/chills -Anything else that concerns you
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
## 2022-12-21 ENCOUNTER — Other Ambulatory Visit: Payer: Self-pay

## 2022-12-21 ENCOUNTER — Encounter (HOSPITAL_COMMUNITY): Payer: Self-pay | Admitting: *Deleted

## 2022-12-21 ENCOUNTER — Emergency Department (HOSPITAL_COMMUNITY): Payer: Commercial Managed Care - HMO

## 2022-12-21 ENCOUNTER — Inpatient Hospital Stay (HOSPITAL_COMMUNITY)
Admission: EM | Admit: 2022-12-21 | Discharge: 2022-12-26 | DRG: 603 | Disposition: A | Discharge status: Home / Self Care | Payer: Commercial Managed Care - HMO | Attending: Internal Medicine | Admitting: Internal Medicine

## 2022-12-21 DIAGNOSIS — L03114 Cellulitis of left upper limb: Secondary | ICD-10-CM | POA: Diagnosis not present

## 2022-12-21 DIAGNOSIS — I10 Essential (primary) hypertension: Secondary | ICD-10-CM | POA: Diagnosis present

## 2022-12-21 DIAGNOSIS — F419 Anxiety disorder, unspecified: Secondary | ICD-10-CM | POA: Diagnosis present

## 2022-12-21 DIAGNOSIS — Z9109 Other allergy status, other than to drugs and biological substances: Secondary | ICD-10-CM

## 2022-12-21 DIAGNOSIS — Z7982 Long term (current) use of aspirin: Secondary | ICD-10-CM

## 2022-12-21 DIAGNOSIS — M7989 Other specified soft tissue disorders: Secondary | ICD-10-CM | POA: Diagnosis present

## 2022-12-21 DIAGNOSIS — A419 Sepsis, unspecified organism: Secondary | ICD-10-CM

## 2022-12-21 DIAGNOSIS — L03119 Cellulitis of unspecified part of limb: Principal | ICD-10-CM

## 2022-12-21 DIAGNOSIS — M1A9XX0 Chronic gout, unspecified, without tophus (tophi): Secondary | ICD-10-CM | POA: Diagnosis present

## 2022-12-21 DIAGNOSIS — F4312 Post-traumatic stress disorder, chronic: Secondary | ICD-10-CM | POA: Diagnosis present

## 2022-12-21 DIAGNOSIS — E785 Hyperlipidemia, unspecified: Secondary | ICD-10-CM | POA: Diagnosis present

## 2022-12-21 DIAGNOSIS — Z79899 Other long term (current) drug therapy: Secondary | ICD-10-CM

## 2022-12-21 DIAGNOSIS — Z91013 Allergy to seafood: Secondary | ICD-10-CM

## 2022-12-21 DIAGNOSIS — L02512 Cutaneous abscess of left hand: Secondary | ICD-10-CM | POA: Diagnosis present

## 2022-12-21 DIAGNOSIS — F209 Schizophrenia, unspecified: Secondary | ICD-10-CM | POA: Diagnosis present

## 2022-12-21 DIAGNOSIS — F4310 Post-traumatic stress disorder, unspecified: Secondary | ICD-10-CM | POA: Insufficient documentation

## 2022-12-21 DIAGNOSIS — T63301A Toxic effect of unspecified spider venom, accidental (unintentional), initial encounter: Secondary | ICD-10-CM | POA: Diagnosis present

## 2022-12-21 DIAGNOSIS — L03012 Cellulitis of left finger: Secondary | ICD-10-CM | POA: Diagnosis not present

## 2022-12-21 DIAGNOSIS — F319 Bipolar disorder, unspecified: Secondary | ICD-10-CM | POA: Diagnosis present

## 2022-12-21 DIAGNOSIS — F17210 Nicotine dependence, cigarettes, uncomplicated: Secondary | ICD-10-CM | POA: Diagnosis present

## 2022-12-21 LAB — URINALYSIS, W/ REFLEX TO CULTURE (INFECTION SUSPECTED)
Bilirubin Urine: NEGATIVE
Glucose, UA: NEGATIVE mg/dL
Ketones, ur: 5 mg/dL — AB
Leukocytes,Ua: NEGATIVE
Nitrite: NEGATIVE
Protein, ur: NEGATIVE mg/dL
Specific Gravity, Urine: 1.027 (ref 1.005–1.030)
pH: 5 (ref 5.0–8.0)

## 2022-12-21 LAB — CBC WITH DIFFERENTIAL/PLATELET
Abs Immature Granulocytes: 0.05 10*3/uL (ref 0.00–0.07)
Basophils Absolute: 0 10*3/uL (ref 0.0–0.1)
Basophils Relative: 0 %
Eosinophils Absolute: 0 10*3/uL (ref 0.0–0.5)
Eosinophils Relative: 0 %
HCT: 38.6 % — ABNORMAL LOW (ref 39.0–52.0)
Hemoglobin: 12.9 g/dL — ABNORMAL LOW (ref 13.0–17.0)
Immature Granulocytes: 0 %
Lymphocytes Relative: 8 %
Lymphs Abs: 1.2 10*3/uL (ref 0.7–4.0)
MCH: 30.1 pg (ref 26.0–34.0)
MCHC: 33.4 g/dL (ref 30.0–36.0)
MCV: 90 fL (ref 80.0–100.0)
Monocytes Absolute: 0.8 10*3/uL (ref 0.1–1.0)
Monocytes Relative: 5 %
Neutro Abs: 12.2 10*3/uL — ABNORMAL HIGH (ref 1.7–7.7)
Neutrophils Relative %: 87 %
Platelets: 315 10*3/uL (ref 150–400)
RBC: 4.29 MIL/uL (ref 4.22–5.81)
RDW: 13.3 % (ref 11.5–15.5)
WBC: 14.2 10*3/uL — ABNORMAL HIGH (ref 4.0–10.5)
nRBC: 0 % (ref 0.0–0.2)

## 2022-12-21 LAB — COMPREHENSIVE METABOLIC PANEL
ALT: 25 U/L (ref 0–44)
AST: 23 U/L (ref 15–41)
Albumin: 3.2 g/dL — ABNORMAL LOW (ref 3.5–5.0)
Alkaline Phosphatase: 53 U/L (ref 38–126)
Anion gap: 12 (ref 5–15)
BUN: 7 mg/dL (ref 6–20)
CO2: 23 mmol/L (ref 22–32)
Calcium: 8.7 mg/dL — ABNORMAL LOW (ref 8.9–10.3)
Chloride: 104 mmol/L (ref 98–111)
Creatinine, Ser: 1.27 mg/dL — ABNORMAL HIGH (ref 0.61–1.24)
GFR, Estimated: >60 mL/min (ref >60)
Glucose, Bld: 152 mg/dL — ABNORMAL HIGH (ref 70–99)
Potassium: 3.7 mmol/L (ref 3.5–5.1)
Sodium: 139 mmol/L (ref 135–145)
Total Bilirubin: 0.4 mg/dL (ref 0.3–1.2)
Total Protein: 6.3 g/dL — ABNORMAL LOW (ref 6.5–8.1)

## 2022-12-21 LAB — I-STAT CG4 LACTIC ACID, ED: Lactic Acid, Venous: 1.3 mmol/L (ref 0.5–1.9)

## 2022-12-21 LAB — C-REACTIVE PROTEIN: CRP: 5.2 mg/dL — ABNORMAL HIGH (ref <1.0)

## 2022-12-21 LAB — PROTIME-INR
INR: 1 (ref 0.8–1.2)
Prothrombin Time: 13.5 s (ref 11.4–15.2)

## 2022-12-21 MED ORDER — PIPERACILLIN-TAZOBACTAM 3.375 G IVPB 30 MIN
3.3750 g | Freq: Once | INTRAVENOUS | Status: AC
  Administered 2022-12-21: 3.375 g via INTRAVENOUS
  Filled 2022-12-21: qty 50

## 2022-12-21 MED ORDER — IOHEXOL 350 MG/ML SOLN
75.0000 mL | Freq: Once | INTRAVENOUS | Status: AC | PRN
  Administered 2022-12-21: 75 mL via INTRAVENOUS

## 2022-12-21 MED ORDER — PRAZOSIN HCL 2 MG PO CAPS
2.0000 mg | ORAL_CAPSULE | Freq: Every day | ORAL | Status: DC
  Administered 2022-12-21 – 2022-12-25 (×5): 2 mg via ORAL
  Filled 2022-12-21 (×6): qty 1

## 2022-12-21 MED ORDER — LIDOCAINE HCL 1 % IJ SOLN: 5.0000 mL | Freq: Once | INTRAMUSCULAR | Status: DC

## 2022-12-21 MED ORDER — LACTATED RINGERS IV BOLUS
1000.0000 mL | Freq: Once | INTRAVENOUS | Status: AC
  Administered 2022-12-21: 1000 mL via INTRAVENOUS

## 2022-12-21 MED ORDER — LIDOCAINE HCL (PF) 1 % IJ SOLN
INTRAMUSCULAR | Status: AC
  Administered 2022-12-21: 5 mL via INTRADERMAL
  Filled 2022-12-21: qty 5

## 2022-12-21 MED ORDER — POLYETHYLENE GLYCOL 3350 17 G PO PACK
17.0000 g | PACK | Freq: Every day | ORAL | Status: DC | PRN
  Administered 2022-12-23: 17 g via ORAL
  Filled 2022-12-21: qty 1

## 2022-12-21 MED ORDER — LACTATED RINGERS IV SOLN
INTRAVENOUS | Status: AC
  Administered 2022-12-21: 1000 mL via INTRAVENOUS

## 2022-12-21 MED ORDER — DOCUSATE SODIUM 100 MG PO CAPS
100.0000 mg | ORAL_CAPSULE | Freq: Two times a day (BID) | ORAL | Status: DC
  Administered 2022-12-21 – 2022-12-26 (×10): 100 mg via ORAL
  Filled 2022-12-21 (×10): qty 1

## 2022-12-21 MED ORDER — HYDROXYZINE HCL 25 MG PO TABS
50.0000 mg | ORAL_TABLET | Freq: Every day | ORAL | Status: DC
  Administered 2022-12-21: 50 mg via ORAL
  Filled 2022-12-21: qty 2

## 2022-12-21 MED ORDER — AMLODIPINE BESYLATE 10 MG PO TABS
10.0000 mg | ORAL_TABLET | Freq: Every day | ORAL | Status: DC
  Administered 2022-12-22 – 2022-12-25 (×4): 10 mg via ORAL
  Filled 2022-12-21 (×5): qty 1

## 2022-12-21 MED ORDER — QUETIAPINE FUMARATE 200 MG PO TABS
400.0000 mg | ORAL_TABLET | Freq: Every day | ORAL | Status: DC
  Administered 2022-12-21: 400 mg via ORAL
  Filled 2022-12-21: qty 4

## 2022-12-21 MED ORDER — ACETAMINOPHEN 500 MG PO TABS
1000.0000 mg | ORAL_TABLET | Freq: Four times a day (QID) | ORAL | Status: DC
  Administered 2022-12-22 – 2022-12-24 (×9): 1000 mg via ORAL
  Filled 2022-12-21 (×9): qty 2

## 2022-12-21 MED ORDER — ACETAMINOPHEN 325 MG PO TABS: 650.0000 mg | ORAL_TABLET | Freq: Four times a day (QID) | ORAL | Status: DC | PRN

## 2022-12-21 MED ORDER — OXYCODONE HCL 5 MG PO TABS
5.0000 mg | ORAL_TABLET | Freq: Four times a day (QID) | ORAL | Status: DC | PRN
  Administered 2022-12-22 – 2022-12-25 (×10): 5 mg via ORAL
  Filled 2022-12-21 (×10): qty 1

## 2022-12-21 MED ORDER — VANCOMYCIN HCL 2000 MG/400ML IV SOLN
2000.0000 mg | Freq: Once | INTRAVENOUS | Status: AC
  Administered 2022-12-21: 2000 mg via INTRAVENOUS
  Filled 2022-12-21: qty 400

## 2022-12-21 MED ORDER — ATORVASTATIN CALCIUM 40 MG PO TABS
40.0000 mg | ORAL_TABLET | Freq: Every day | ORAL | Status: DC
  Administered 2022-12-22 – 2022-12-26 (×5): 40 mg via ORAL
  Filled 2022-12-21 (×5): qty 1

## 2022-12-21 MED ORDER — SODIUM CHLORIDE 0.9 % IV SOLN: 1.0000 g | Freq: Once | INTRAVENOUS | Status: DC

## 2022-12-21 MED ORDER — ACETAMINOPHEN 650 MG RE SUPP: 650.0000 mg | Freq: Four times a day (QID) | RECTAL | Status: DC | PRN

## 2022-12-21 MED ORDER — LIDOCAINE HCL 2 % IJ SOLN
5.0000 mL | Freq: Once | INTRAMUSCULAR | Status: AC
  Administered 2022-12-21: 100 mg via INTRADERMAL

## 2022-12-21 MED ORDER — HYDROMORPHONE HCL 1 MG/ML IJ SOLN
1.0000 mg | INTRAMUSCULAR | Status: AC | PRN
  Administered 2022-12-21 – 2022-12-24 (×3): 1 mg via INTRAVENOUS
  Filled 2022-12-21 (×3): qty 1

## 2022-12-21 NOTE — ED Notes (Signed)
Provider at bedside performing I &D

## 2022-12-21 NOTE — Progress Notes (Signed)
ED Pharmacy Antibiotic Sign Off An antibiotic consult was received from an ED provider for vancomycin and zosyn per pharmacy dosing for cellulitis. A chart review was completed to assess appropriateness.   The following one time order(s) were placed:  Zosyn 3.375g x1  Vancomycin 2000mg  x1   Further antibiotic and/or antibiotic pharmacy consults should be ordered by the admitting provider if indicated.   Thank you for allowing pharmacy to be a part of this patient's care.   Estill Batten, PharmD, BCCCP  Clinical Pharmacist 12/21/22 8:01 PM

## 2022-12-21 NOTE — ED Provider Notes (Signed)
[facility] AT [facility] Provider Note   CSN: [id number] Arrival date & time: 12/21/22  1830     History  Chief Complaint  Patient presents with   Hand Pain    Cory Henderson is a 47 y.o. male.   Hand Pain     47 year old male with medical history significant for bipolar disorder, schizophrenia, HTN, anxiety, presenting to the emergency department with left hand finger swelling and pain.  The patient states that "I was bit by a spider about 5 days ago" and ever since the patient has been having worsening pain and swelling in the left finger and now the dorsum of the left hand.  Pain and swelling and redness is spreading up his arm.  He endorses severe 10 out of 10 pain.  He has difficulty extending the left first digit.  He does state that he is right-handed.  He has a lesion to the medial aspect of the left first digit in the area where he thinks he was bit by an insect or spider.  He endorses chills and low-grade fevers.  Home Medications Prior to Admission medications   Medication Sig Start Date End Date Taking? Authorizing Provider  allopurinol (ZYLOPRIM) 300 MG tablet Take 300 mg by mouth daily. 09/17/22  Yes [provider]  amLODipine (NORVASC) 10 MG tablet Take 10 mg by mouth daily. 07/26/21  Yes [provider]  atorvastatin (LIPITOR) 40 MG tablet Take 40 mg by mouth daily. 08/01/21  Yes [provider]  hydrOXYzine (ATARAX) 50 MG tablet Take 50 mg by mouth at bedtime. 09/16/21  Yes [provider]  prazosin (MINIPRESS) 2 MG capsule Take 2 mg by mouth at bedtime. 06/30/22  Yes [provider]  QUEtiapine (SEROQUEL) 400 MG tablet Take 400 mg by mouth at bedtime. 09/16/21  Yes [provider]      Allergies    Fish allergy, Other, and Stearyl alcohol    Review of Systems   Review of Systems  All other systems reviewed and are negative.   Physical Exam Updated Vital Signs BP (!)  148/82 (BP Location: Right Arm)   Pulse 81   Temp (!) 100.4 F (38 C) (Oral)   Resp 18   Ht 6' (1.829 m)   Wt 111.1 kg   SpO2 98%   BMI 33.22 kg/m  Physical Exam Vitals and nursing note reviewed.  Constitutional:      General: He is not in acute distress.    Appearance: He is not ill-appearing.  HENT:     Head: Normocephalic and atraumatic.  Eyes:     Conjunctiva/sclera: Conjunctivae normal.     Pupils: Pupils are equal, round, and reactive to light.  Cardiovascular:     Rate and Rhythm: Regular rhythm. Tachycardia present.  Pulmonary:     Effort: Pulmonary effort is normal. No respiratory distress.  Abdominal:     General: There is no distension.     Tenderness: There is no guarding.  Musculoskeletal:        General: Swelling, tenderness and signs of injury present.     Cervical back: Neck supple.     Comments: Left first digit with fusiform swelling, lesion along the medial aspect, tenderness to palpation about the proximal flexor tendon sheath, finger held in passive flexion, pain with passive extension noted.  Patient also has significant swelling about the dorsum of the hand with associated tenderness to palpation, swelling tracks up to the left forearm.  Mild erythema and warmth present.  Skin:    Findings: No lesion or rash.  Neurological:     General: No focal deficit present.     Mental Status: He is alert. Mental status is at baseline.      ED Results / Procedures / Treatments   Labs (all labs ordered are listed, but only abnormal results are displayed) Labs Reviewed  COMPREHENSIVE METABOLIC PANEL - Abnormal; Notable for the following components:      Result Value   Glucose, Bld 152 (*)    Creatinine, Ser 1.27 (*)    Calcium 8.7 (*)    Total Protein 6.3 (*)    Albumin 3.2 (*)    All other components within normal limits  CBC WITH DIFFERENTIAL/PLATELET - Abnormal; Notable for the following components:   WBC 14.2 (*)    Hemoglobin 12.9 (*)    HCT 38.6  (*)    Neutro Abs 12.2 (*)    All other components within normal limits  URINALYSIS, W/ REFLEX TO CULTURE (INFECTION SUSPECTED) - Abnormal; Notable for the following components:   Hgb urine dipstick SMALL (*)    Ketones, ur 5 (*)    Bacteria, UA RARE (*)    All other components within normal limits  CULTURE, BLOOD (ROUTINE X 2)  CULTURE, BLOOD (ROUTINE X 2)  PROTIME-INR  C-REACTIVE PROTEIN  I-STAT CG4 LACTIC ACID, ED  I-STAT CG4 LACTIC ACID, ED    EKG None  Radiology No results found.  Procedures .Critical Care  Performed by: Ernie Avena, MD Authorized by: Ernie Avena, MD   Critical care provider statement:    Critical care time (minutes):  30   Critical care was necessary to treat or prevent imminent or life-threatening deterioration of the following conditions:  Sepsis   Critical care was time spent personally by me on the following activities:  Development of treatment plan with patient or surrogate, discussions with consultants, evaluation of patient's response to treatment, examination of patient, ordering and review of laboratory studies, ordering and review of radiographic studies, ordering and performing treatments and interventions, pulse oximetry, re-evaluation of patient's condition and review of old charts   Care discussed with: admitting provider       Medications Ordered in ED Medications  HYDROmorphone (DILAUDID) injection 1 mg (1 mg Intravenous Given 12/21/22 1957)  lactated ringers infusion (1,000 mLs Intravenous New Bag/Given 12/21/22 2002)  acetaminophen (TYLENOL) suppository 650 mg (has no administration in time range)  vancomycin (VANCOREADY) IVPB 2000 mg/400 mL (has no administration in time range)  piperacillin-tazobactam (ZOSYN) IVPB 3.375 g (3.375 g Intravenous New Bag/Given 12/21/22 2025)  lactated ringers bolus 1,000 mL (1,000 mLs Intravenous New Bag/Given 12/21/22 1957)    ED Course/ Medical Decision Making/ A&P Clinical Course as of  12/21/22 2045  Sun Dec 21, 2022  1935 WBC(!): 14.2 [JL]  1935 Pulse Rate(!): 101 [JL]  1935 Temp(!): 100.7 F (38.2 C) [JL]    Clinical Course User Index [JL] Ernie Avena, MD                                 Medical Decision Making Amount and/or Complexity of Data Reviewed Labs: ordered. Decision-making details documented in ED Course. Radiology: ordered.  Risk OTC drugs. Prescription drug management. Decision regarding hospitalization.    47 year old male with medical history significant for bipolar disorder, schizophrenia, HTN, anxiety, presenting to the emergency department with left hand finger swelling and pain.  The patient states that "I was bit by a spider about 5 days ago" and ever since the patient has been having worsening pain and swelling in the left finger and now the dorsum of the left hand.  Pain and swelling and redness is spreading up his arm.  He endorses severe 10 out of 10 pain.  He has difficulty extending the left first digit.  He does state that he is right-handed.  He has a lesion to the medial aspect of the left first digit in the area where he thinks he was bit by an insect or spider.  He endorses chills and low-grade fevers.  On arrival, the patient was febrile to 100.7, tachycardic heart rate 101, hemodynamically stable BP 151/101, saturating well on room air.  Exam concerning for potential flexor tenosynovitis versus dorsal cellulitis.  Given the source of infection, meeting SIRS criteria, code sepsis was called.  IV access was obtained, the patient was administered IV fluid bolus, blood cultures were obtained prior to antibiotic administration the patient was covered with broad-spectrum antibiotics.  Laboratory evaluation revealed a leukocytosis to 14.2 with a neutrophilia and left shift, urinalysis without evidence of UTI, CMP with a serum creatinine that appears to be at baseline, no other significant electrolyte abnormality, mild hyperglycemia at 152,  initial lactic acid 1.3.  The patient was provided with Dilaudid for pain control.  I consulted hand surgery due to concern for flexor tenosynovitis versus general dorsal  hand cellulitis and discussed the care of the patient with Dr. Hulda Humphrey.  He will see the patient in consultation, agreed with antibiotics and admission.  He recommended x-ray imaging of the left hand and addition of the CRP to labs.  XR Left Hand: soft tissue swelling without fracture on my read, formal radiology read pending.  CT Left Hand W: Requested by hand surgery, pending.  Discussed with the medicine service with plan for admission due to concern for sepsis from a dorsal hand cellulitis,  CT imaging read pending at time of admission.  Per hand surgery, likely plan left hand infection, cellulitis versus abscess versus pyogenic flexor tenosynovitis.  "Most likely severe dorsal hand cellulitis, continue broad-spectrum antibiotics, CT to evaluate for possible developing abscess."  A volar splint was ordered for placement by the orthopedic technician by hand surgery.  The patient was then admitted to the internal medicine teaching service in stabilized condition.   Final Clinical Impression(s) / ED Diagnoses Final diagnoses:  Cellulitis of dorsum of hand  Cellulitis of finger of left hand  Sepsis, due to unspecified organism, unspecified whether acute organ dysfunction present [facility])    Rx / DC Orders ED Discharge Orders     None         Ernie Avena, MD 12/21/22 2102
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
## 2022-12-21 NOTE — ED Notes (Signed)
Pt returned to room from CT

## 2022-12-21 NOTE — ED Notes (Signed)
ED Provider at bedside. 
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
## 2022-12-21 NOTE — Hospital Course (Addendum)
[name] is a 47 yo with a medical history of HTN, bipolar disorder, and schizophrenia who presented with a 5 day history of left hand pain and swelling following a spider bite and was admitted by the internal medicine teaching service for cellulitis.   Left upper extremity cellulitis Patient presented with a 5-day history of left hand pain and swelling following a spider bite. No history of immunosuppression, HIV negative and Hgb A1C of 5.7. On presentation patient was febrile and mildly tachycardic. Labs reviewed and notable for leukocytosis of 14.2, CRP 5.2. Physical exam showed ~49mm lesion on base of second left digit with surrounding erythema, no apparent drainage. Significant edema of dorsal left hand extending to distal forearm with palpable pulses. No crepitus was detected on palpation. Patient's ROM and sensation on the dorsal aspect of the left hand were reduced. There was low concern for necrotizing fasciitis or osteomyelitis on imaging. Of note imaging did show some suspicion of intramuscular edema in the hand concerning for myofasciitis.   Patient was started on IV vancomycin on admission, and BC were collected. Pain improved with PRN medications including Tylenol, IV dilaudid PRN, and oxy 5 mg PRN. Patient s/p I&D by Dr. Hulda Humphrey (ortho) overnight 10/6-10/7 and a repeat I&D 10/7. Per Dr. Hulda Humphrey, no surgical intervention was indicated. Initially the use of a volar splint was recommended, although its use was discontinued by Dr. Hulda Humphrey as it was no longer needed.    Patient continued to show moderate edema of the left hand with an open, non-bleeding purulent wound on the left second digit on days 2 and 3. Pain, sensation, left wrist ROM, and finger/hand strength continued to improve. CRP continued to be elevated with a stable but elevated WBC count. Patient also reported chills and a need to change the thermostat in his room frequently. IV Ceftriaxone was added and team followed Dr. Maryclare Labrador  recommendations for soapy soaks TID and wound care. Held Tylenol on day 3 to determine if patient had any breakthrough fevers. Social work was also engaged to help patient connect with a PCP as he has not been following with anyone regarding his other medical problems and concerns.   On day 4 patient's swelling remained relatively unchanged with increased pus drainage with soapy soaks. WBC and CRP were both noted to be decreasing. Continued with IV Ceftriaxone and IV Vancomycin.   On day of discharge his hand swelling was improved as well as his ability to flex the second left digit, left wrist flexion/extension, and sensation on the dorsal surface of his hand. Continued having some itch on the palm and dorsal surface of the left hand. Left radial pulse was intact. Labs also showed some improvement ***.   RBC on UA UA from admission showing RBC/HPF 6-10. Patient denied gross hematuria or dysuria. Plan for outpatient follow up.    Bipolar disorder PTSD Chronic.Per patient, he follows with [facility] for psychiatric medication refills. Last fill history is for August, although patient states he is taking his medications. Continued home Seroquel 400 mg daily at bedtime, prazosin 2 mg daily at bedtime, and adjusted his home hydroxyzine 50 mg daily to be 25 mg PRN.    HTN Chronic. Continued home amlodipine 10 mg daily. Patient's blood pressures initially elevated to 150s/100s, then intermittently elevated to 140s/80s-100s. Blood pressures 100s-110s/60s-70s on day of discharge. Will follow up outpatient.   Gout Chronic. Per patient no recent flares. Did not resume patient's home allopurinol 300 mg tablet daily. Follow up outpatient once  patient establishes with PCP.  ---------------------------- 10/11 still continuing with soapy soaps  Will need a work note. [employer]    Patient instructions:  You were seen for your left hand infection. You were treated with incision and drainage  by orthopedic surgery (Dr. Hulda Humphrey), IV antibiotics, and pain management as needed.   On the day of discharge, you were started on two oral antibiotics called Doxycycline and Augmentin. You will take both of these medications twice a day for 5 days (10/11-10/15). You will also continue to do soapy soaks three times a day and will continue to wrap your hand the way you were instructed to avoid further injury or infection. Please try to keep your wrapping dry and clean.   We advise you to stay away from food preparation until your antibiotics are completed and your open hand wound on the left index finger has closed up. Once it is closed/healed, you can place a small bandage on the wound under a glove.   You will receive a short course of pain medication (oxy 5 mg) which you can take every 12 hours for up to 3 days for pain. You can also take over the counter Tylenol 650 mg every 6 hours as needed.   You can continue to take your home medications as prescribed, including:  - Amlodipine 10 mg daily for blood pressure control  - Atorvastatin 40 mg daily for heart health and stroke prevention  - Prazosin 2 mg daily at bedtime for PTSD/sleep - Quetiapine 400 mg daily at bedtime for psych/sleep - Hydroxyzine 50 mg daily at bedtime for anxiety, also helps with itch  - Allopurinol 300 mg daily for gout  We have made a hospitalization follow-up appointment for you with our Internal Medicine Clinic, which is located on the ground floor of [facility]. Your appointment will be on:   October 24 at 8:45 AM with Dr. Rudene Christians.  You can call our clinic at [phone] between 8 AM and 5 PM if you have any questions or concerns.   Please make sure you go to this appointment so we can see how your hand has healed, provide any medication refills you may need, and help manage any of your other medical concerns.   If your infection or pain gets worse, you get a new fever that doesn't go away, you are  unable to eat or drink, you become confused, or there is a sudden change in how you are feeling please call 911 or go to your closest urgent care or emergency department.

## 2022-12-21 NOTE — Sepsis Progress Note (Signed)
[facility] following for sepsis protocol. 

## 2022-12-21 NOTE — ED Triage Notes (Signed)
Possible temp he's not sure  increasing pain for 2 days

## 2022-12-21 NOTE — ED Triage Notes (Addendum)
The pt is c/o swelling and hand pain.  He reports being bitten by a spider brown or black one week ago  he has a open wound  to the lt index finger the entire hand and his lt arm is very swollen also

## 2022-12-21 NOTE — Procedures (Signed)
Procedure note  Procedure performed: left index finger I&D  Description: Procedure and risks/benefits discussed with patient and all questions answered. Patient consented to proceed with the left index finger I&D.   Index finger digital block with 1% lidocaine performed under sterile conditions. A scalpel was used to make a 1 cm incision over the radial index finger at the lesion. A hemostat was used to bluntly dissect. Approximately 2cc of pus were expressed from the wound. The wound was irrigated with 1L saline. A sterile dressing and splint was applied. Patient tolerate procedure well.  Luci Bank 12/21/2022, 11:12 PM Orthopaedic Surgery

## 2022-12-21 NOTE — ED Notes (Signed)
Patient transported to CT 

## 2022-12-21 NOTE — Progress Notes (Signed)
Orthopedic Tech Progress Note Patient Details:  Cory Henderson 1975/08/11 [id number]  Ortho Devices Type of Ortho Device: Volar splint Ortho Device/Splint Location: lue long volar Ortho Device/Splint Interventions: Ordered, Application, Adjustment   Post Interventions Patient Tolerated: Well Instructions Provided: Care of device, Adjustment of device  Trinna Post 12/21/2022, 10:01 PM

## 2022-12-22 DIAGNOSIS — L03114 Cellulitis of left upper limb: Secondary | ICD-10-CM

## 2022-12-22 DIAGNOSIS — Z9109 Other allergy status, other than to drugs and biological substances: Secondary | ICD-10-CM | POA: Diagnosis not present

## 2022-12-22 DIAGNOSIS — F4310 Post-traumatic stress disorder, unspecified: Secondary | ICD-10-CM

## 2022-12-22 DIAGNOSIS — L02512 Cutaneous abscess of left hand: Secondary | ICD-10-CM | POA: Diagnosis present

## 2022-12-22 DIAGNOSIS — F209 Schizophrenia, unspecified: Secondary | ICD-10-CM | POA: Diagnosis present

## 2022-12-22 DIAGNOSIS — F319 Bipolar disorder, unspecified: Secondary | ICD-10-CM

## 2022-12-22 DIAGNOSIS — E785 Hyperlipidemia, unspecified: Secondary | ICD-10-CM | POA: Diagnosis present

## 2022-12-22 DIAGNOSIS — I10 Essential (primary) hypertension: Secondary | ICD-10-CM | POA: Diagnosis present

## 2022-12-22 DIAGNOSIS — Z7982 Long term (current) use of aspirin: Secondary | ICD-10-CM | POA: Diagnosis not present

## 2022-12-22 DIAGNOSIS — F4312 Post-traumatic stress disorder, chronic: Secondary | ICD-10-CM | POA: Diagnosis present

## 2022-12-22 DIAGNOSIS — Z91013 Allergy to seafood: Secondary | ICD-10-CM | POA: Diagnosis not present

## 2022-12-22 DIAGNOSIS — T63301A Toxic effect of unspecified spider venom, accidental (unintentional), initial encounter: Secondary | ICD-10-CM | POA: Diagnosis present

## 2022-12-22 DIAGNOSIS — Z79899 Other long term (current) drug therapy: Secondary | ICD-10-CM | POA: Diagnosis not present

## 2022-12-22 DIAGNOSIS — L03012 Cellulitis of left finger: Secondary | ICD-10-CM | POA: Diagnosis present

## 2022-12-22 DIAGNOSIS — F419 Anxiety disorder, unspecified: Secondary | ICD-10-CM | POA: Diagnosis present

## 2022-12-22 DIAGNOSIS — M1A9XX0 Chronic gout, unspecified, without tophus (tophi): Secondary | ICD-10-CM | POA: Diagnosis present

## 2022-12-22 DIAGNOSIS — F17210 Nicotine dependence, cigarettes, uncomplicated: Secondary | ICD-10-CM | POA: Diagnosis present

## 2022-12-22 DIAGNOSIS — M7989 Other specified soft tissue disorders: Secondary | ICD-10-CM | POA: Diagnosis present

## 2022-12-22 LAB — HEMOGLOBIN A1C
Hgb A1c MFr Bld: 5.7 % — ABNORMAL HIGH (ref 4.8–5.6)
Mean Plasma Glucose: 116.89 mg/dL

## 2022-12-22 LAB — CBC
HCT: 34.7 % — ABNORMAL LOW (ref 39.0–52.0)
Hemoglobin: 11.5 g/dL — ABNORMAL LOW (ref 13.0–17.0)
MCH: 29.8 pg (ref 26.0–34.0)
MCHC: 33.1 g/dL (ref 30.0–36.0)
MCV: 89.9 fL (ref 80.0–100.0)
Platelets: 259 10*3/uL (ref 150–400)
RBC: 3.86 MIL/uL — ABNORMAL LOW (ref 4.22–5.81)
RDW: 13.4 % (ref 11.5–15.5)
WBC: 12 10*3/uL — ABNORMAL HIGH (ref 4.0–10.5)
nRBC: 0 % (ref 0.0–0.2)

## 2022-12-22 LAB — HIV ANTIBODY (ROUTINE TESTING W REFLEX): HIV Screen 4th Generation wRfx: NONREACTIVE

## 2022-12-22 LAB — BASIC METABOLIC PANEL
Anion gap: 9 (ref 5–15)
BUN: 8 mg/dL (ref 6–20)
CO2: 23 mmol/L (ref 22–32)
Calcium: 8 mg/dL — ABNORMAL LOW (ref 8.9–10.3)
Chloride: 107 mmol/L (ref 98–111)
Creatinine, Ser: 1.22 mg/dL (ref 0.61–1.24)
GFR, Estimated: >60 mL/min (ref >60)
Glucose, Bld: 113 mg/dL — ABNORMAL HIGH (ref 70–99)
Potassium: 3.5 mmol/L (ref 3.5–5.1)
Sodium: 139 mmol/L (ref 135–145)

## 2022-12-22 MED ORDER — VANCOMYCIN HCL IN DEXTROSE 1-5 GM/200ML-% IV SOLN
1000.0000 mg | Freq: Two times a day (BID) | INTRAVENOUS | Status: DC
  Administered 2022-12-22 – 2022-12-25 (×8): 1000 mg via INTRAVENOUS
  Filled 2022-12-22 (×8): qty 200

## 2022-12-22 MED ORDER — HYDROXYZINE HCL 25 MG PO TABS
25.0000 mg | ORAL_TABLET | Freq: Three times a day (TID) | ORAL | Status: DC | PRN
  Administered 2022-12-22 – 2022-12-24 (×2): 25 mg via ORAL
  Filled 2022-12-22 (×2): qty 1

## 2022-12-22 MED ORDER — ENOXAPARIN SODIUM 40 MG/0.4ML IJ SOSY
40.0000 mg | PREFILLED_SYRINGE | INTRAMUSCULAR | Status: DC
  Administered 2022-12-22 – 2022-12-25 (×3): 40 mg via SUBCUTANEOUS
  Filled 2022-12-22 (×4): qty 0.4

## 2022-12-22 MED ORDER — QUETIAPINE FUMARATE 200 MG PO TABS
400.0000 mg | ORAL_TABLET | Freq: Every day | ORAL | Status: DC
  Administered 2022-12-22 – 2022-12-25 (×4): 400 mg via ORAL
  Filled 2022-12-22 (×5): qty 2
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
## 2022-12-22 NOTE — Progress Notes (Signed)
Pharmacy Antibiotic Note  Cory Henderson is a 47 y.o. male admitted on 12/21/2022 with  cellulitis/wound infection of the left upper extremity .  Pharmacy has been consulted for Vancomycin dosing.  Plan: Vancomycin 2000 mg IV x 1, then 1000 mg IV q12h >>>Estimated AUC: 487 Trend WBC, temp, renal function  F/U infectious work-up Drug levels as indicated   Height: 6' (182.9 cm) Weight: 111.1 kg (244 lb 14.9 oz) IBW/kg (Calculated) : 77.6  Temp (24hrs), Avg:100 F (37.8 C), Min:98.8 F (37.1 C), Max:100.7 F (38.2 C)  Recent Labs  Lab 12/21/22 1852 12/21/22 1854 12/22/22 0059  WBC 14.2*  --  12.0*  CREATININE 1.27*  --  1.22  LATICACIDVEN  --  1.3  --     Estimated Creatinine Clearance: 98.4 mL/min (by C-G formula based on SCr of 1.22 mg/dL).    Allergies  Allergen Reactions   Fish Allergy Anaphylaxis, Hives and Itching   Other Rash and Other (See Comments)    Skin Bleaching-sunscreen   Stearyl Alcohol Rash    Itching    Abran Duke, PharmD, BCPS Clinical Pharmacist Phone: [phone]

## 2022-12-22 NOTE — Progress Notes (Signed)
Hospitalization day 1  Summary: [name] is a 47 yo with a medical history of HTN, bipolar disorder, and schizophrenia who presented with a 5 day history of left hand pain and swelling following a spider bite and was admitted by the internal medicine teaching service for cellulitis.   Subjective:  No overnight events reported. Patient reports similar degree of pain following I&D, believes swelling is not improved. Reports some improvement with pain medications. Observed trying to express purulent pus from left hand wound, reminded him to avoid touching the area. Denies CP, SOB, N/V, abdominal pain, or other acute complaint. Endorsed some heart palpitations but believes this is most likely from anxiety.   Objective:  Vital signs in last 24 hours: Vitals:   12/22/22 0256 12/22/22 0313 12/22/22 0547 12/22/22 0715  BP: 110/68 127/71 120/71 (!) 148/95  Pulse: (!) 102 100 89 78  Resp: 18 18 18 16   Temp: 98 F (36.7 C) (!) 101.2 F (38.4 C) 99.8 F (37.7 C) 99.8 F (37.7 C)  TempSrc: Oral Oral Oral Oral  SpO2: 96% 99% 98% 100%  Weight:      Height:          Latest Ref Rng & Units 12/22/2022   12:59 AM 12/21/2022    6:52 PM 10/29/2022    3:21 PM  CBC  WBC 4.0 - 10.5 K/uL 12.0  14.2  6.6   Hemoglobin 13.0 - 17.0 g/dL 16.1  09.6  04.5   Hematocrit 39.0 - 52.0 % 34.7  38.6  40.4   Platelets 150 - 400 K/uL 259  315  302       Latest Ref Rng & Units 12/22/2022   12:59 AM 12/21/2022    6:52 PM 10/29/2022    3:21 PM  CMP  Glucose 70 - 99 mg/dL 409  811  92   BUN 6 - 20 mg/dL 8  7  13    Creatinine 0.61 - 1.24 mg/dL 9.14  7.82  9.56   Sodium 135 - 145 mmol/L 139  139  136   Potassium 3.5 - 5.1 mmol/L 3.5  3.7  4.0   Chloride 98 - 111 mmol/L 107  104  103   CO2 22 - 32 mmol/L 23  23  25    Calcium 8.9 - 10.3 mg/dL 8.0  8.7  8.9   Total Protein 6.5 - 8.1 g/dL  6.3    Total Bilirubin 0.3 - 1.2 mg/dL  0.4    Alkaline Phos 38 - 126 U/L  53    AST 15 - 41 U/L  23    ALT 0 - 44 U/L   25      Blood cultures: no growth < 12 hours  Physical Exam Constitutional: Patient is resting in bed in no acute distress CV: Regular rate and rhythm without murmurs on auscultation. No LE edema.  Pulmonary/Respiratory: Normal respiratory effort on room air.  Abdominal: Soft, non-tender, non-distended. MSK: Left hand edema with open, non-bleeding purulent wound on the second digit; reduced ROM of left wrist and decreased sensation on dorsal aspect of left hand and fingers; spontaneously lifting both upper extremities without increased pain; left and right radial pulses intact; sensation WNL on left forearm and bicep.  Neuro: Oriented to self, place, and year.  Skin: Warm and dry. Psych: Normal mood, flat affect.  Assessment/Plan:  Principal Problem:   Cellulitis of left upper extremity Active Problems:   Bipolar 1 disorder (HCC)   Essential hypertension   PTSD (post-traumatic  stress disorder)  Patient is a 47 yo with a history of HTN, bipolar disorder, and schizophrenia who presented on 10/06 with 5 day history of left hand pain and swelling and was admitted for cellulitis.    #Left upper extremity cellulitis Patient presented with a 5-day history of left hand pain and swelling following a spider bite. No history of immunosuppression. Low concern for necrotizing fasciitis or osteomyelitis on imaging. There is some suspicion of intramuscular edema in the hand, which may be concerning for myofasciitis. Patient started on IV vancomycin on admission, with [facility] pending. He remained afebrile and HDS overnight. Pain improved with PRN medications. Patient s/p I&D by Dr. Hulda Humphrey (ortho) overnight and a repeat I&D this morning. Per Dr. Hulda Humphrey, no surgical intervention is indicated at this time.   Physical exam still showing moderate edema of the left hand with a small, open, non-bleeding purulent wound on the second digit. Patient with reduced ROM of the left wrist and decreased sensation on dorsal  aspect of left hand and fingers. Patient able to move left upper extremity spontaneously without increased pain. Left radial pulse intact, and sensation WNL on forearm and bicep. Will monitor.  Plan:  - Continue IV vancomycin  - Volar splint for soft tissue  - PRN Tylenol, oxy, dilaudid  - Follow up BC - Trend CBC  - Monitor physical exams for acute changes  #Bipolar disorder -Continue home Seroquel 400 mg daily at bedtime   #HTN -Continue home amlodipine 10 mg daily    #PTSD -Continue home prazosin 2 mg daily at bedtime, hydroxyzine 50 mg daily at bedtime    Diet: HH VTE: sq Lovenox IVF: None Code: Full  Prior to Admission Living Arrangement: home Anticipated Discharge Location: home Barriers to Discharge: pending medical stability  Dispo: Anticipated discharge in approximately 1-2 day(s).   Philomena Doheny, MD, PGY-1 12/22/2022, 9:56 AM Pager: [phone] After 5pm on weekdays and 1pm on weekends: On Call pager [phone]

## 2022-12-22 NOTE — Progress Notes (Signed)
.  Subjective: No events overnight. Patient lost splint. He states his pain is improving since bedside I&D  Activity level:  NWB with splint for soft tissue rest Diet tolerance:  as tolerated Voiding:  as tolerated Patient reports pain as moderate.    Objective: Vital signs in last 24 hours: Temp:  [98 F (36.7 C)-101.2 F (38.4 C)] 99.8 F (37.7 C) (10/07 0715) Pulse Rate:  [78-102] 78 (10/07 0715) Resp:  [16-18] 16 (10/07 0715) BP: (110-154)/(68-101) 148/95 (10/07 0715) SpO2:  [96 %-100 %] 100 % (10/07 0715) Weight:  [111.1 kg] 111.1 kg (10/06 1837)  Labs: Recent Labs    12/21/22 1852 12/22/22 0059  HGB 12.9* 11.5*   Recent Labs    12/21/22 1852 12/22/22 0059  WBC 14.2* 12.0*  RBC 4.29 3.86*  HCT 38.6* 34.7*  PLT 315 259   Recent Labs    12/21/22 1852 12/22/22 0059  NA 139 139  K 3.7 3.5  CL 104 107  CO2 23 23  BUN 7 8  CREATININE 1.27* 1.22  GLUCOSE 152* 113*  CALCIUM 8.7* 8.0*   Recent Labs    12/21/22 1852  INR 1.0    Physical Exam:  Neurologically intact  Left hand I&D incision with some drainage of fibrinous material Swelling and erythema or base of index finger much improved Minimal tenderness to palpation along flexor index finger Continued swelling and erythema of dorsal hand, some improvement Motor and sensory intact 2+ radial pulse  Assessment/Plan:  47 year old male with left index finger abscess 1 day s/p bedside I&D with surrounding cellulitis    Plan: -Patient appears to be significantly improved following I&D last night, no plan for surgical intervention now -Continue broad spectrum IV abx with plan to transition to oral abx at discharge -Continue hand splint and elevation for soft tissue rest -Recommend starting left hand warm soapy soaks TID followed by dry dressing changes -Orthopedics to continued to follow, rest of medical management per medical team    Luci Bank 12/22/2022, 9:00 AM

## 2022-12-23 DIAGNOSIS — L03114 Cellulitis of left upper limb: Secondary | ICD-10-CM | POA: Diagnosis not present

## 2022-12-23 LAB — CBC
HCT: 36 % — ABNORMAL LOW (ref 39.0–52.0)
Hemoglobin: 12.1 g/dL — ABNORMAL LOW (ref 13.0–17.0)
MCH: 30.3 pg (ref 26.0–34.0)
MCHC: 33.6 g/dL (ref 30.0–36.0)
MCV: 90.2 fL (ref 80.0–100.0)
Platelets: 269 10*3/uL (ref 150–400)
RBC: 3.99 MIL/uL — ABNORMAL LOW (ref 4.22–5.81)
RDW: 13.2 % (ref 11.5–15.5)
WBC: 11.9 10*3/uL — ABNORMAL HIGH (ref 4.0–10.5)
nRBC: 0 % (ref 0.0–0.2)

## 2022-12-23 LAB — BASIC METABOLIC PANEL
Anion gap: 9 (ref 5–15)
BUN: 6 mg/dL (ref 6–20)
CO2: 25 mmol/L (ref 22–32)
Calcium: 8.1 mg/dL — ABNORMAL LOW (ref 8.9–10.3)
Chloride: 105 mmol/L (ref 98–111)
Creatinine, Ser: 1.04 mg/dL (ref 0.61–1.24)
GFR, Estimated: >60 mL/min (ref >60)
Glucose, Bld: 105 mg/dL — ABNORMAL HIGH (ref 70–99)
Potassium: 3.5 mmol/L (ref 3.5–5.1)
Sodium: 139 mmol/L (ref 135–145)

## 2022-12-23 NOTE — Progress Notes (Signed)
Transition of Care [facility]) - Inpatient Brief Assessment   Patient Details  Name: Langdon Crosson MRN: [id number] Date of Birth: 04/23/1975  [facility] of [facility] CM/SW Contact:    Janae Bridgeman, RN Phone Number: 12/23/2022, 12:25 PM   Clinical Narrative: CM spoke with attending MD.  Patient needs PCP set up.  [facility] placed in the AVs for patient for call and schedule a hospital follow up.   Transition of Care Asessment: Insurance and Status: (P) Insurance coverage has been reviewed Patient has primary care physician: (P) No (Patient to follow up with [facility] for PCP follow up/ Hospital follow up) Home environment has been reviewed: (P) from home Prior level of function:: (P) Independent Prior/Current Home Services: (P) No current home services Social Determinants of Health Reivew: (P) SDOH reviewed interventions complete Readmission risk has been reviewed: (P) Yes Transition of care needs: (P) no transition of care needs at this time

## 2022-12-23 NOTE — Progress Notes (Addendum)
Hospitalization day 1  Summary: [name] is a 47 yo with a medical history of HTN, bipolar disorder, and schizophrenia who presented with a 5 day history of left hand pain and swelling following a spider bite and was admitted by the internal medicine teaching service for cellulitis.   Subjective:  Patient requested seroquel last night, resumed at bedtime. Per patient this morning, has been getting his seroquel and other psych medications from [facility], usually via telehealth visits. Reports he slept well on seroquel, has tried melatonin in the past but felt it was too weak and didn't help. When asked about his hand pain he reported pain is full, still feeling swollen. Has been soaking his left hand in soapy solution per ortho instructions. States overall movement in left hand left arm is improved, but has new itch on dorsal surface of left hand. Otherwise denies any other acute pain, N/V, abdominal pain, loose stools, or confusion. Volar splint not at bedside.   Objective:  Vital signs in last 24 hours: Vitals:   12/22/22 1607 12/22/22 2104 12/23/22 0620 12/23/22 0758  BP: 115/75 (!) 150/98 130/83 131/77  Pulse: 75 83 81 86  Resp: 16 20 18 19   Temp: 98.8 F (37.1 C) 99.6 F (37.6 C) 98.8 F (37.1 C) 98.6 F (37 C)  TempSrc: Oral Oral Oral Oral  SpO2: 98% 100% 97% 98%  Weight:      Height:          Latest Ref Rng & Units 12/23/2022    4:51 AM 12/22/2022   12:59 AM 12/21/2022    6:52 PM  CBC  WBC 4.0 - 10.5 K/uL 11.9  12.0  14.2   Hemoglobin 13.0 - 17.0 g/dL 65.7  84.6  96.2   Hematocrit 39.0 - 52.0 % 36.0  34.7  38.6   Platelets 150 - 400 K/uL 269  259  315       Latest Ref Rng & Units 12/23/2022    4:51 AM 12/22/2022   12:59 AM 12/21/2022    6:52 PM  CMP  Glucose 70 - 99 mg/dL 952  841  324   BUN 6 - 20 mg/dL 6  8  7    Creatinine 0.61 - 1.24 mg/dL 4.01  0.27  2.53   Sodium 135 - 145 mmol/L 139  139  139   Potassium 3.5 - 5.1 mmol/L 3.5  3.5  3.7   Chloride 98 - 111  mmol/L 105  107  104   CO2 22 - 32 mmol/L 25  23  23    Calcium 8.9 - 10.3 mg/dL 8.1  8.0  8.7   Total Protein 6.5 - 8.1 g/dL   6.3   Total Bilirubin 0.3 - 1.2 mg/dL   0.4   Alkaline Phos 38 - 126 U/L   53   AST 15 - 41 U/L   23   ALT 0 - 44 U/L   25     Blood cultures: no growth 2 days  HIV non reactive   A1C 5.7   Physical Exam Constitutional: Patient is resting in bed in no acute distress, conversing appropriately. Pulmonary/Respiratory: Normal respiratory effort on room air.  MSK: Left hand ROM improved, some tenderness over wrist, left radial pulse intact, some purulent discharge evident on left hand wound. Lifting and moving left upper extremity without issue or increased pain. Sensation WNL left forearm and bicep, continues to be decreased over dorsal surface of the hand. Hand grip about the same on both  sides, able to form a fist better. Neuro: Alert and oriented, answering and asking questions appropriately.  Psych: Normal mood and affect.   Assessment/Plan:  Principal Problem:   Cellulitis of left upper extremity Active Problems:   Bipolar 1 disorder (HCC)   Essential hypertension   PTSD (post-traumatic stress disorder)  Patient is a 47 yo with a history of HTN, bipolar disorder, and schizophrenia who presented on 10/06 with 5 day history of left hand pain and swelling and was admitted for cellulitis.    #Left upper extremity cellulitis Patient s/p I&D by Dr. Hulda Humphrey (ortho) overnight 10/6-10/7 and a repeat I&D 10/7. Per Dr. Hulda Humphrey, no surgical intervention is indicated at this time and IV vancomycin continues to be appropriate.   Physical exam showing some improvement in edema, pain, and ROM of the left hand. Per nursing, ortho tech to deliver volar splint, will follow up with ortho as needed. Also following ortho instructions for wound care, including the soapy soaks.   Plan:  - Continue IV vancomycin  - f/u Volar splint  - PRN Tylenol, oxy, dilaudid  - Follow up BC -  Trend CBC  - Monitor physical exams for acute changes   #RBC on UA UA from admission showing RBC/HPF 6-10. Patient denying gross hematuria or dysuria. Will monitor and plan for outpatient follow up.   #Bipolar disorder Patient obtaining psych meds from [facility] with no PCP to help manage all medications. Engaged social work, who will help provide patient with information he can follow up on at discharge. Originally planned to hold seroquel given intermittent filling history but patient reports it is very helpful for his sleep as other medications have not provided the same effect.  -Continue home Seroquel 400 mg daily at bedtime   #HTN -Continue home amlodipine 10 mg daily    #PTSD -Continue home prazosin 2 mg daily at bedtime, hydroxyzine 50 mg daily at bedtime    Diet: HH VTE: sq Lovenox IVF: None Code: Full  Prior to Admission Living Arrangement: home Anticipated Discharge Location: home Barriers to Discharge: pending medical stability  Dispo: Anticipated discharge in approximately 1-2 day(s).   Philomena Doheny, MD, PGY-1 12/23/2022, 12:18 PM Pager: [phone] After 5pm on weekdays and 1pm on weekends: On Call pager [phone]

## 2022-12-23 NOTE — Plan of Care (Signed)

## 2022-12-23 NOTE — Progress Notes (Signed)
.  Subjective:    No events overnight. Patient lost splint. He states his pain is improving but hand still feels swollen and itchy   Activity level:  Patient no longer needs splint and can work on hand and finger ROM to improve swelling Diet tolerance:  as tolerated Voiding:  as tolerated Patient reports pain as moderate.    Objective: Vital signs in last 24 hours: Temp:  [98.6 F (37 C)-99.6 F (37.6 C)] 98.8 F (37.1 C) (10/08 1613) Pulse Rate:  [71-86] 71 (10/08 1613) Resp:  [18-20] 18 (10/08 1613) BP: (120-150)/(70-98) 120/70 (10/08 1613) SpO2:  [97 %-100 %] 100 % (10/08 1613)  Labs: Recent Labs    12/21/22 1852 12/22/22 0059 12/23/22 0451  HGB 12.9* 11.5* 12.1*   Recent Labs    12/22/22 0059 12/23/22 0451  WBC 12.0* 11.9*  RBC 3.86* 3.99*  HCT 34.7* 36.0*  PLT 259 269   Recent Labs    12/22/22 0059 12/23/22 0451  NA 139 139  K 3.5 3.5  CL 107 105  CO2 23 25  BUN 8 6  CREATININE 1.22 1.04  GLUCOSE 113* 105*  CALCIUM 8.0* 8.1*   Recent Labs    12/21/22 1852  INR 1.0    Physical Exam: Neurologically intact   Left hand I&D incision with some drainage of fibrinous fluid/small amount of pus Swelling and erythema or base of index finger continues to improve Minimal tenderness to palpation along flexor index finger Continued swelling and erythema of dorsal hand, some improvement Motor and sensory intact 2+ radial pulse  Assessment/Plan: 47 year old male with left index finger abscess 2 day s/p bedside I&D with surrounding cellulitis    Plan: -Patient continues to improve following I&D and antibiotic, no plan for surgical intervention now -Continue vancomycin -Patient can be WBAT without splint and can work on finger and hand ROM -Continu left hand warm soapy soaks TID followed by dry dressing changes, performed with patient this morning and instructed him how to continue after discharge -Orthopedics to continued to follow, rest of medical  management per medical team -Anticipate patient will be ready for discharge on oral abx tomorrow if continued improvement   Luci Bank 12/23/2022, 4:29 PM

## 2022-12-24 DIAGNOSIS — L03114 Cellulitis of left upper limb: Secondary | ICD-10-CM | POA: Diagnosis not present

## 2022-12-24 LAB — CBC
HCT: 38 % — ABNORMAL LOW (ref 39.0–52.0)
Hemoglobin: 12.6 g/dL — ABNORMAL LOW (ref 13.0–17.0)
MCH: 30.1 pg (ref 26.0–34.0)
MCHC: 33.2 g/dL (ref 30.0–36.0)
MCV: 90.7 fL (ref 80.0–100.0)
Platelets: 304 10*3/uL (ref 150–400)
RBC: 4.19 MIL/uL — ABNORMAL LOW (ref 4.22–5.81)
RDW: 13.1 % (ref 11.5–15.5)
WBC: 11.9 10*3/uL — ABNORMAL HIGH (ref 4.0–10.5)
nRBC: 0 % (ref 0.0–0.2)

## 2022-12-24 LAB — C-REACTIVE PROTEIN: CRP: 6.9 mg/dL — ABNORMAL HIGH (ref <1.0)

## 2022-12-24 MED ORDER — POLYETHYLENE GLYCOL 3350 17 G PO PACK
17.0000 g | PACK | Freq: Every day | ORAL | Status: DC
  Administered 2022-12-24 – 2022-12-26 (×3): 17 g via ORAL
  Filled 2022-12-24 (×3): qty 1

## 2022-12-24 MED ORDER — SODIUM CHLORIDE 0.9 % IV SOLN
1.0000 g | INTRAVENOUS | Status: DC
  Administered 2022-12-24 – 2022-12-25 (×2): 1 g via INTRAVENOUS
  Filled 2022-12-24 (×2): qty 10

## 2022-12-24 MED ORDER — ACETAMINOPHEN 325 MG PO TABS: 650.0000 mg | ORAL_TABLET | Freq: Four times a day (QID) | ORAL | Status: DC | PRN

## 2022-12-24 NOTE — Plan of Care (Signed)
Problem: Education: Goal: Knowledge of General Education information will improve Description: Including pain rating scale, medication(s)/side effects and non-pharmacologic comfort measures Outcome: Progressing Pt understands he was admitted with five days of left hand pain and swelling accompanied by fevers and fatigue following a suspected spider bite.  Pt has an admitting diagnosis of LUE cellulitis.  He understands he will be on IV abx per MD's orders.  Pt had an I/D on 12/22/2022.    Problem: Clinical Measurements: Goal: Will remain free from infection Outcome: Progressing S/Sx of infection monitored and assessed q-shift.  Pt has remained afebrile thus far.  He is on IV abx per MD's orders.      Problem: Clinical Measurements: Goal: Respiratory complications will improve Outcome: Progressing Respiratory status monitored and assessed q-shift.  Pt is on room air with PO2 at 96-100% and respiration rate of 18-19 breaths per minute.  Pt has not endorsed c/o SOB or DOE.    Problem: Clinical Measurements: Goal: Cardiovascular complication will be avoided Outcome: Progressing Pt's VS WNL thus far.    Problem: Activity: Goal: Risk for activity intolerance will decrease Outcome: Progressing Pt is independent of all his ADLs.  He can get up OOB with a steady gait independently.    Problem: Nutrition: Goal: Adequate nutrition will be maintained Outcome: Progressing Pt is on a soft diet per MD's orders and can tolerate it w/o s/sx of abdominal pain/ distention or n/v.    Problem: Safety: Goal: Ability to remain free from injury will improve Outcome: Progressing Pt has remained free from falls thus far.  Instructed pt to utilize RN call light for assistance.  Hourly rounds performed.  Bed in lowest position, locked with two upper side rails engaged.  Belongings and call light within reach.    Problem: Skin Integrity: Goal: Risk for impaired skin integrity will decrease Outcome:  Progressing Skin integrity monitored and assessed q-shift.  Instructed pt to turn q2 hours to prevent further skin impairment.  Tubes and drains assessed for device related pressures sores.  Pt is continent of both bowel and bladder.

## 2022-12-24 NOTE — Progress Notes (Addendum)
Summary: [name] is a 47 yo with a medical history of HTN, bipolar disorder, and schizophrenia who presented with a 5 day history of left hand pain and swelling following a spider bite and was admitted by the internal medicine teaching service for cellulitis.   Subjective:  No overnight events. Reports he has been having to change the thermostat in his room due to feeling cold then hot. Denies any N/V, endorses some constipation. Hand continues to be swollen but states the pain is slightly improved, less itch. It is bandaged up today and he had not been soaking his hand into the soapy mixture to let the pus out per ortho instructions. Endorses eating and drinking without any issue.   Objective:  Vital signs in last 24 hours: Vitals:   12/23/22 2000 12/23/22 2325 12/24/22 0434 12/24/22 0801  BP: (!) 145/94 (!) 150/79 123/76 120/69  Pulse: 89 90 81 80  Resp: 18 18 19 18   Temp: 98.9 F (37.2 C) 99.2 F (37.3 C) 99.3 F (37.4 C) 98.3 F (36.8 C)  TempSrc: Oral Oral Oral   SpO2: 99% 100% 97% 96%  Weight:      Height:          Latest Ref Rng & Units 12/24/2022    4:49 AM 12/23/2022    4:51 AM 12/22/2022   12:59 AM  CBC  WBC 4.0 - 10.5 K/uL 11.9  11.9  12.0   Hemoglobin 13.0 - 17.0 g/dL 16.1  09.6  04.5   Hematocrit 39.0 - 52.0 % 38.0  36.0  34.7   Platelets 150 - 400 K/uL 304  269  259    Blood cultures: no growth 3 days  CRP: 6.9 (up from 5.2, 3 days ago)  Physical Exam Constitutional: Patient is resting in bed, no acute distress, answering and asking questions appropriately.  Pulmonary/Respiratory: Normal respiratory effort on room air.  MSK: Left  hand wrapped, unwrapped appears to have some purulent discharge on the left hand wound. Improved ROM and hand grip strength compared to yesterday. Sensation and radial pulse intact. Swelling unchanged from yesterday. Neuro: Alert and oriented, conversing appropriately.  Psych: Normal mood and affect.    Assessment/Plan:  Principal Problem:   Cellulitis of left upper extremity Active Problems:   Bipolar 1 disorder (HCC)   Essential hypertension   PTSD (post-traumatic stress disorder)  Patient is a 47 yo with a history of HTN, bipolar disorder, and schizophrenia who presented on 10/06 with 5 day history of left hand pain and swelling and was admitted for cellulitis.    #LUE cellulitis with abscess of the index finger Patient s/p I&D by Dr. Hulda Humphrey (ortho) overnight 10/6-10/7 and a repeat I&D 10/7. Per Dr. Hulda Humphrey, no surgical intervention is indicated and there is no further need for the volar splint. Patient with swelling with purulent discharge same as yesterday, some improvement in pain and ROM/strength. Seems to be experiencing chills, which is new. WBC count unchanged, still elevated at 11.9. CRP increased from admission. BC no growth 3 days.   Will continue with ortho recommendations for wound care. Broadening given unchanged swelling, purulent discharge, and chills with increased inflammatory markers.   Plan:  - Continue IV vancomycin  - Start IV ceftriaxone 10/9 - TID soapy soaks + bandage/wrapping changes - PRN Tylenol, oxy, dilaudid  - Follow up BC - Trend CBC  - Monitor physical exams for acute changes  #RBC on UA UA from admission showing RBC/HPF 6-10. Patient denying gross hematuria  or dysuria. Will monitor and plan for outpatient follow up.   #Bipolar disorder Patient obtaining psych meds from [facility] with no PCP to help manage all medications. Social work engaged to help with outpatient PCP follow up.  -Continue home Seroquel 400 mg daily at bedtime.   #HTN -Continue home amlodipine 10 mg daily.    #PTSD -Continue home prazosin 2 mg daily at bedtime, hydroxyzine 50 mg daily at bedtime.    Diet: Regular VTE: sq Lovenox IVF: None Code: Full  Prior to Admission Living Arrangement: home Anticipated Discharge Location: home Barriers to Discharge: pending medical  stability  Dispo: Anticipated discharge in approximately 1-2 day(s).   Philomena Doheny, MD, PGY-1 12/24/2022, 1:26 PM Pager: [phone] After 5pm on weekdays and 1pm on weekends: On Call pager [phone]

## 2022-12-25 DIAGNOSIS — L02512 Cutaneous abscess of left hand: Secondary | ICD-10-CM | POA: Diagnosis not present

## 2022-12-25 DIAGNOSIS — L03114 Cellulitis of left upper limb: Secondary | ICD-10-CM | POA: Diagnosis not present

## 2022-12-25 LAB — CBC
HCT: 37.6 % — ABNORMAL LOW (ref 39.0–52.0)
Hemoglobin: 12.4 g/dL — ABNORMAL LOW (ref 13.0–17.0)
MCH: 29 pg (ref 26.0–34.0)
MCHC: 33 g/dL (ref 30.0–36.0)
MCV: 88.1 fL (ref 80.0–100.0)
Platelets: 346 10*3/uL (ref 150–400)
RBC: 4.27 MIL/uL (ref 4.22–5.81)
RDW: 12.8 % (ref 11.5–15.5)
WBC: 9.6 10*3/uL (ref 4.0–10.5)
nRBC: 0 % (ref 0.0–0.2)

## 2022-12-25 LAB — BASIC METABOLIC PANEL
Anion gap: 11 (ref 5–15)
BUN: 11 mg/dL (ref 6–20)
CO2: 25 mmol/L (ref 22–32)
Calcium: 8.8 mg/dL — ABNORMAL LOW (ref 8.9–10.3)
Chloride: 102 mmol/L (ref 98–111)
Creatinine, Ser: 1.05 mg/dL (ref 0.61–1.24)
GFR, Estimated: >60 mL/min (ref >60)
Glucose, Bld: 110 mg/dL — ABNORMAL HIGH (ref 70–99)
Potassium: 3.9 mmol/L (ref 3.5–5.1)
Sodium: 138 mmol/L (ref 135–145)

## 2022-12-25 LAB — C-REACTIVE PROTEIN: CRP: 4.6 mg/dL — ABNORMAL HIGH (ref <1.0)

## 2022-12-25 NOTE — Progress Notes (Addendum)
Summary: [name] is a 47 yo with a medical history of HTN, bipolar disorder, and schizophrenia who presented with a 5 day history of left hand pain and swelling following a spider bite and was admitted by the internal medicine teaching service for cellulitis.   Subjective:  Patient endorses improved left hand pain, swelling, and itch. Patient endorsing some continued decreased sensation on dorsal surface of left hand. Denies N/V, no chills today. No other complaints today. Has been soaking left hand with drainage of pus.   Objective:  Vital signs in last 24 hours: Vitals:   12/24/22 1934 12/24/22 2105 12/25/22 0615 12/25/22 0718  BP: (!) 153/120 (!) 148/91 123/88 122/73  Pulse: 100 72 71 83  Resp: 18  16   Temp: 98.5 F (36.9 C)  98.5 F (36.9 C) 98.6 F (37 C)  TempSrc: Oral  Oral   SpO2: 99% 93% 95% 97%  Weight:      Height:          Latest Ref Rng & Units 12/25/2022    5:11 AM 12/24/2022    4:49 AM 12/23/2022    4:51 AM  CBC  WBC 4.0 - 10.5 K/uL 9.6  11.9  11.9   Hemoglobin 13.0 - 17.0 g/dL 64.4  03.4  74.2   Hematocrit 39.0 - 52.0 % 37.6  38.0  36.0   Platelets 150 - 400 K/uL 346  304  269       Latest Ref Rng & Units 12/25/2022    5:11 AM 12/23/2022    4:51 AM 12/22/2022   12:59 AM  BMP  Glucose 70 - 99 mg/dL 595  638  756   BUN 6 - 20 mg/dL 11  6  8    Creatinine 0.61 - 1.24 mg/dL 4.33  2.95  1.88   Sodium 135 - 145 mmol/L 138  139  139   Potassium 3.5 - 5.1 mmol/L 3.9  3.5  3.5   Chloride 98 - 111 mmol/L 102  105  107   CO2 22 - 32 mmol/L 25  25  23    Calcium 8.9 - 10.3 mg/dL 8.8  8.1  8.0     Blood cultures: no growth 3 days  CRP: 4.6  Physical Exam Constitutional: Sitting on side of bed soaking hand in soapy mixture. Conversing appropriately.  Pulmonary/Respiratory:  MSK: Left second digit with pus/discharge coming out during soaks, tenderness around MCP joint. Wrist ROM, hand strength unchanged from yesterday. Dorsal surface of hand with  decreased sensation. L radial pulse intact.  Neuro: Alert and oriented, asking and answering questions appropriately.  Psych: Normal mood and affect.   Assessment/Plan:  Principal Problem:   Cellulitis of left upper extremity Active Problems:   Bipolar 1 disorder (HCC)   Essential hypertension   PTSD (post-traumatic stress disorder)  Patient is a 47 yo with a history of HTN, bipolar disorder, and schizophrenia who presented on 10/06 with 5 day history of left hand pain and swelling and was admitted for cellulitis.    #LUE cellulitis with abscess of the index finger Patient s/p I&D by Dr. Hulda Humphrey (ortho) overnight 10/6-10/7 and a repeat I&D 10/7. Per Dr. Hulda Humphrey, no surgical intervention is indicated and there is no further need for the volar splint. Patient with continued swelling but improved pain and ROM. No chills N/V. WBC count improved. CRP downtrending, 4.6 today compared to 6.9 yesterday. BC no growth 3 days.    Will continue with ortho recommendations for wound care.  Plan:  - Continue IV vancomycin  - Continue IV ceftriaxone 10/9 - TID soapy soaks + bandage/wrapping changes - PRN Tylenol, oxy, dilaudid  - Follow up BC - Trend CBC, CRP  - Monitor physical exams for acute changes  #RBC on UA UA from admission showing RBC/HPF 6-10. Patient denying gross hematuria or dysuria. Will monitor and plan for outpatient follow up.    #Bipolar disorder Patient obtaining psych meds from [facility] with no PCP to help manage all medications. Social work engaged to help with outpatient PCP follow up.  -Continue home Seroquel 400 mg daily at bedtime.   #HTN -Continue home amlodipine 10 mg daily.    #PTSD -Continue home prazosin 2 mg daily at bedtime, hydroxyzine 50 mg daily at bedtime.    Diet: Regular VTE: sq Lovenox IVF: None Code: Full  Prior to Admission Living Arrangement: home Anticipated Discharge Location: home Barriers to Discharge: pending medical stability  Dispo:  Anticipated discharge in approximately 1-2 day(s).   Philomena Doheny, MD, PGY-1 12/25/2022, 8:32 AM Pager: [phone] After 5pm on weekdays and 1pm on weekends: On Call pager [phone]

## 2022-12-25 NOTE — Plan of Care (Signed)
Problem: Education: Goal: Knowledge of General Education information will improve Description: Including pain rating scale, medication(s)/side effects and non-pharmacologic comfort measures Outcome: Progressing Pt understands he was admitted with five days of left hand pain and swelling accompanied by fevers and fatigue following a suspected spider bite.  Pt has an admitting diagnosis of LUE cellulitis.  He understands he will be on IV abx per MD's orders.  Pt had an I/D on 12/22/2022.    Problem: Clinical Measurements: Goal: Will remain free from infection Outcome: Progressing S/Sx of infection monitored and assessed q-shift.  Pt has remained afebrile thus far.  He is on IV abx per MD's orders.      Problem: Clinical Measurements: Goal: Respiratory complications will improve Outcome: Progressing Respiratory status monitored and assessed q-shift.  Pt is on room air with PO2 at 95-97% and respiration rate of 16-18 breaths per minute.  Pt has not endorsed c/o SOB or DOE.    Problem: Clinical Measurements: Goal: Cardiovascular complication will be avoided Outcome: Progressing Pt's VS WNL thus far.    Problem: Activity: Goal: Risk for activity intolerance will decrease Outcome: Progressing Pt is independent of all his ADLs.  He can get up OOB with a steady gait independently.    Problem: Nutrition: Goal: Adequate nutrition will be maintained Outcome: Progressing Pt is on a regular diet per MD's orders and can tolerate it w/o s/sx of abdominal pain/ distention or n/v.    Problem: Safety: Goal: Ability to remain free from injury will improve Outcome: Progressing Pt has remained free from falls thus far.  Instructed pt to utilize RN call light for assistance.  Hourly rounds performed.  Bed in lowest position, locked with two upper side rails engaged.  Belongings and call light within reach.    Problem: Skin Integrity: Goal: Risk for impaired skin integrity will decrease Outcome:  Progressing Skin integrity monitored and assessed q-shift.  Instructed pt to turn q2 hours to prevent further skin impairment.  Tubes and drains assessed for device related pressures sores.  Pt is continent of both bowel and bladder.

## 2022-12-26 ENCOUNTER — Other Ambulatory Visit (HOSPITAL_COMMUNITY): Payer: Self-pay

## 2022-12-26 DIAGNOSIS — L03114 Cellulitis of left upper limb: Secondary | ICD-10-CM | POA: Diagnosis not present

## 2022-12-26 LAB — CBC
HCT: 40.5 % (ref 39.0–52.0)
Hemoglobin: 13.6 g/dL (ref 13.0–17.0)
MCH: 30 pg (ref 26.0–34.0)
MCHC: 33.6 g/dL (ref 30.0–36.0)
MCV: 89.4 fL (ref 80.0–100.0)
Platelets: 416 10*3/uL — ABNORMAL HIGH (ref 150–400)
RBC: 4.53 MIL/uL (ref 4.22–5.81)
RDW: 12.7 % (ref 11.5–15.5)
WBC: 8.1 10*3/uL (ref 4.0–10.5)
nRBC: 0 % (ref 0.0–0.2)

## 2022-12-26 LAB — C-REACTIVE PROTEIN: CRP: 2 mg/dL — ABNORMAL HIGH (ref <1.0)

## 2022-12-26 LAB — CULTURE, BLOOD (ROUTINE X 2)
Culture: NO GROWTH
Culture: NO GROWTH
Special Requests: ADEQUATE
Special Requests: ADEQUATE

## 2022-12-26 LAB — HIGH SENSITIVITY CRP: CRP, High Sensitivity: 71.53 mg/L — ABNORMAL HIGH (ref 0.00–3.00)

## 2022-12-26 MED ORDER — OXYCODONE HCL 5 MG PO TABS: 5.0000 mg | ORAL_TABLET | Freq: Two times a day (BID) | ORAL | Status: DC | PRN

## 2022-12-26 MED ORDER — DOXYCYCLINE HYCLATE 100 MG PO TABS
100.0000 mg | ORAL_TABLET | Freq: Two times a day (BID) | ORAL | Status: DC
  Administered 2022-12-26: 100 mg via ORAL
  Filled 2022-12-26: qty 1

## 2022-12-26 MED ORDER — OXYCODONE HCL 5 MG PO TABS
5.0000 mg | ORAL_TABLET | ORAL | 0 refills | Status: DC | PRN
Start: 2022-12-26 — End: 2023-01-22
  Filled 2022-12-26: qty 6, 1d supply, fill #0

## 2022-12-26 MED ORDER — DOXYCYCLINE HYCLATE 100 MG PO TABS
100.0000 mg | ORAL_TABLET | Freq: Two times a day (BID) | ORAL | 0 refills | Status: AC
  Filled 2022-12-26: qty 9, 5d supply, fill #0

## 2022-12-26 MED ORDER — HYDROCERIN EX CREA
TOPICAL_CREAM | Freq: Two times a day (BID) | CUTANEOUS | Status: DC
  Filled 2022-12-26: qty 113

## 2022-12-26 MED ORDER — AMOXICILLIN-POT CLAVULANATE 875-125 MG PO TABS
1.0000 | ORAL_TABLET | Freq: Two times a day (BID) | ORAL | 0 refills | Status: AC
  Filled 2022-12-26: qty 9, 5d supply, fill #0

## 2022-12-26 MED ORDER — ACETAMINOPHEN 325 MG PO TABS: 650.0000 mg | ORAL_TABLET | Freq: Four times a day (QID) | ORAL | Status: DC | PRN

## 2022-12-26 MED ORDER — AMOXICILLIN-POT CLAVULANATE 875-125 MG PO TABS
1.0000 | ORAL_TABLET | Freq: Two times a day (BID) | ORAL | Status: DC
  Administered 2022-12-26: 1 via ORAL
  Filled 2022-12-26: qty 1
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
## 2022-12-26 NOTE — TOC Transition Note (Signed)
Transition of Care [facility]) - CM/SW Discharge Note   Patient Details  Name: Cory Henderson MRN: [id number] Date of Birth: 01/22/1976  [facility] CM/SW Contact:  Janae Bridgeman, RN Phone Number: 12/26/2022, 12:48 PM   Clinical Narrative:    CM met with the patient at the bedside to discuss TOC needs for home.  The patient lives at [place] where he has been working through [employer].  Patient has scheduled appointment with Internal Medicine Center.  Bedside nursing will provide dressing supplies at the home.  Patient was provided with bus passes for home.  Patient given physician letters.  Meds will be provided through [facility] pharmacy.  No other TOc needs at this time.   Final next level of care: Home/Self Care Barriers to Discharge: No Barriers Identified   Patient Goals and CMS Choice CMS Medicare.gov Compare Post Acute Care list provided to:: Patient Choice offered to / list presented to : Patient  Discharge Placement                         Discharge Plan and Services Additional resources added to the After Visit Summary for     Discharge Planning Services: CM Consult            DME Arranged:  (Dressing supplies with be provided for home by bedside nursing)                    Social Determinants of Health (SDOH) Interventions SDOH Screenings   Food Insecurity: No Food Insecurity (12/22/2022)  Housing: Low Risk  (12/22/2022)  Transportation Needs: No Transportation Needs (12/22/2022)  Utilities: Not At Risk (12/22/2022)  Depression (PHQ2-9): Medium Risk (02/06/2020)  Social Connections: Unknown (07/19/2021)   Received from [facility], [facility]  Tobacco Use: High Risk (12/21/2022)     Readmission Risk Interventions    12/26/2022   12:47 PM 12/23/2022   12:23 PM  Readmission Risk Prevention Plan  Post Dischage Appt Complete Complete  Medication Screening Complete Complete  Transportation Screening Complete  Complete

## 2022-12-26 NOTE — Discharge Instructions (Addendum)
You were seen for your left hand infection. You were treated with incision and drainage by orthopedic surgery (Dr. Hulda Humphrey), IV antibiotics, and pain management as needed.   On the day of discharge, you were started on two oral antibiotics called Doxycycline and Augmentin. You will take both of these medications twice a day for 5 days. You got the first dose of both today, you will need to take your second dose of both antibiotics tonight. Then you can resume twice a day for each of the antibiotics. You will also continue to do soapy soaks three times a day and will continue to wrap your hand the way you were instructed to avoid further injury or infection. Please try to keep your wrapping dry and clean.   We advise you to stay away from food preparation until your antibiotics are completed and your open hand wound on the left index finger has closed up. Once it is closed/healed, you can place a small bandage on the wound under a glove.   You will receive a short course of pain medication (oxy 5 mg) which you can take every 12 hours for up to 3 days for pain. You can also take over the counter Tylenol 650 mg every 6 hours as needed.   You can continue to take your home medications as prescribed, including:  - Amlodipine 10 mg daily for blood pressure control  - Atorvastatin 40 mg daily for heart health and stroke prevention  - Prazosin 2 mg daily at bedtime for PTSD/sleep - Quetiapine 400 mg daily at bedtime for psych/sleep - Hydroxyzine 50 mg daily at bedtime for anxiety, also helps with itch  - Allopurinol 300 mg daily for gout  We have made a hospitalization follow-up appointment for you with our Internal Medicine Clinic, which is located on the ground floor of [facility]. Your appointment will be on:   October 24 at 8:45 AM with Dr. Rudene Christians.  You can call our clinic at [phone] between 8 AM and 5 PM if you have any questions or concerns.   Please make sure you go to this  appointment so we can see how your hand has healed, provide any medication refills you may need, and help manage any of your other medical concerns.   If your infection or pain gets worse, you get a new fever that doesn't go away, you are unable to eat or drink, you become confused, or there is a sudden change in how you are feeling please call 911 or go to your closest urgent care or emergency department.

## 2023-01-08 ENCOUNTER — Encounter: Payer: Commercial Managed Care - HMO | Admitting: Internal Medicine

## 2023-01-08 NOTE — Progress Notes (Deleted)
Admitted 10/6-10/11 for cellulitis of left hand  Cellulitis I&D on 10/07 by Dr. Hulda Humphrey. Completed augmentin and doxycycline  Microscopic hematuria Ua  Bipolar F/u w Vesta Mixer

## 2023-01-19 ENCOUNTER — Emergency Department (HOSPITAL_COMMUNITY): Payer: Commercial Managed Care - HMO

## 2023-01-19 ENCOUNTER — Inpatient Hospital Stay (HOSPITAL_COMMUNITY)
Admission: EM | Admit: 2023-01-19 | Discharge: 2023-01-22 | DRG: 917 | Disposition: A | Discharge status: Home / Self Care | Payer: Commercial Managed Care - HMO | Attending: Internal Medicine | Admitting: Internal Medicine

## 2023-01-19 ENCOUNTER — Other Ambulatory Visit: Payer: Self-pay

## 2023-01-19 DIAGNOSIS — Z9109 Other allergy status, other than to drugs and biological substances: Secondary | ICD-10-CM

## 2023-01-19 DIAGNOSIS — R29810 Facial weakness: Secondary | ICD-10-CM | POA: Diagnosis present

## 2023-01-19 DIAGNOSIS — G928 Other toxic encephalopathy: Secondary | ICD-10-CM | POA: Diagnosis present

## 2023-01-19 DIAGNOSIS — F17210 Nicotine dependence, cigarettes, uncomplicated: Secondary | ICD-10-CM | POA: Diagnosis present

## 2023-01-19 DIAGNOSIS — R0981 Nasal congestion: Secondary | ICD-10-CM | POA: Diagnosis present

## 2023-01-19 DIAGNOSIS — I10 Essential (primary) hypertension: Secondary | ICD-10-CM | POA: Diagnosis present

## 2023-01-19 DIAGNOSIS — R011 Cardiac murmur, unspecified: Secondary | ICD-10-CM | POA: Diagnosis present

## 2023-01-19 DIAGNOSIS — T40711A Poisoning by cannabis, accidental (unintentional), initial encounter: Secondary | ICD-10-CM | POA: Diagnosis present

## 2023-01-19 DIAGNOSIS — I67841 Reversible cerebrovascular vasoconstriction syndrome: Secondary | ICD-10-CM | POA: Diagnosis present

## 2023-01-19 DIAGNOSIS — Z9889 Other specified postprocedural states: Secondary | ICD-10-CM

## 2023-01-19 DIAGNOSIS — F1410 Cocaine abuse, uncomplicated: Secondary | ICD-10-CM | POA: Diagnosis present

## 2023-01-19 DIAGNOSIS — Z1152 Encounter for screening for COVID-19: Secondary | ICD-10-CM

## 2023-01-19 DIAGNOSIS — R4701 Aphasia: Secondary | ICD-10-CM | POA: Diagnosis present

## 2023-01-19 DIAGNOSIS — R748 Abnormal levels of other serum enzymes: Secondary | ICD-10-CM | POA: Diagnosis present

## 2023-01-19 DIAGNOSIS — F419 Anxiety disorder, unspecified: Secondary | ICD-10-CM | POA: Diagnosis present

## 2023-01-19 DIAGNOSIS — R569 Unspecified convulsions: Secondary | ICD-10-CM | POA: Diagnosis present

## 2023-01-19 DIAGNOSIS — F319 Bipolar disorder, unspecified: Secondary | ICD-10-CM | POA: Diagnosis present

## 2023-01-19 DIAGNOSIS — R471 Dysarthria and anarthria: Secondary | ICD-10-CM | POA: Diagnosis present

## 2023-01-19 DIAGNOSIS — R0989 Other specified symptoms and signs involving the circulatory and respiratory systems: Secondary | ICD-10-CM | POA: Diagnosis present

## 2023-01-19 DIAGNOSIS — Z87892 Personal history of anaphylaxis: Secondary | ICD-10-CM

## 2023-01-19 DIAGNOSIS — R7303 Prediabetes: Secondary | ICD-10-CM | POA: Diagnosis present

## 2023-01-19 DIAGNOSIS — Y9289 Other specified places as the place of occurrence of the external cause: Secondary | ICD-10-CM

## 2023-01-19 DIAGNOSIS — F1210 Cannabis abuse, uncomplicated: Secondary | ICD-10-CM | POA: Diagnosis present

## 2023-01-19 DIAGNOSIS — T405X1A Poisoning by cocaine, accidental (unintentional), initial encounter: Principal | ICD-10-CM | POA: Diagnosis present

## 2023-01-19 DIAGNOSIS — R059 Cough, unspecified: Secondary | ICD-10-CM | POA: Diagnosis present

## 2023-01-19 DIAGNOSIS — Z888 Allergy status to other drugs, medicaments and biological substances status: Secondary | ICD-10-CM

## 2023-01-19 DIAGNOSIS — I6602 Occlusion and stenosis of left middle cerebral artery: Secondary | ICD-10-CM | POA: Diagnosis present

## 2023-01-19 DIAGNOSIS — G9340 Encephalopathy, unspecified: Secondary | ICD-10-CM | POA: Diagnosis not present

## 2023-01-19 DIAGNOSIS — F4310 Post-traumatic stress disorder, unspecified: Secondary | ICD-10-CM | POA: Diagnosis present

## 2023-01-19 DIAGNOSIS — Z79899 Other long term (current) drug therapy: Secondary | ICD-10-CM

## 2023-01-19 DIAGNOSIS — M6282 Rhabdomyolysis: Secondary | ICD-10-CM | POA: Diagnosis present

## 2023-01-19 DIAGNOSIS — F209 Schizophrenia, unspecified: Secondary | ICD-10-CM | POA: Diagnosis present

## 2023-01-19 DIAGNOSIS — I959 Hypotension, unspecified: Secondary | ICD-10-CM | POA: Diagnosis not present

## 2023-01-19 DIAGNOSIS — Z91013 Allergy to seafood: Secondary | ICD-10-CM

## 2023-01-19 DIAGNOSIS — E785 Hyperlipidemia, unspecified: Secondary | ICD-10-CM | POA: Diagnosis present

## 2023-01-19 DIAGNOSIS — N179 Acute kidney failure, unspecified: Secondary | ICD-10-CM | POA: Diagnosis present

## 2023-01-19 DIAGNOSIS — R400 Somnolence: Secondary | ICD-10-CM | POA: Diagnosis present

## 2023-01-19 DIAGNOSIS — R4182 Altered mental status, unspecified: Principal | ICD-10-CM

## 2023-01-19 LAB — CBC WITH DIFFERENTIAL/PLATELET
Abs Immature Granulocytes: 0.01 10*3/uL (ref 0.00–0.07)
Basophils Absolute: 0 10*3/uL (ref 0.0–0.1)
Basophils Relative: 1 %
Eosinophils Absolute: 0.2 10*3/uL (ref 0.0–0.5)
Eosinophils Relative: 3 %
HCT: 39.5 % (ref 39.0–52.0)
Hemoglobin: 13 g/dL (ref 13.0–17.0)
Immature Granulocytes: 0 %
Lymphocytes Relative: 33 %
Lymphs Abs: 2.5 10*3/uL (ref 0.7–4.0)
MCH: 30.2 pg (ref 26.0–34.0)
MCHC: 32.9 g/dL (ref 30.0–36.0)
MCV: 91.6 fL (ref 80.0–100.0)
Monocytes Absolute: 0.6 10*3/uL (ref 0.1–1.0)
Monocytes Relative: 8 %
Neutro Abs: 4.3 10*3/uL (ref 1.7–7.7)
Neutrophils Relative %: 55 %
Platelets: 328 10*3/uL (ref 150–400)
RBC: 4.31 MIL/uL (ref 4.22–5.81)
RDW: 13.4 % (ref 11.5–15.5)
WBC: 7.7 10*3/uL (ref 4.0–10.5)
nRBC: 0 % (ref 0.0–0.2)

## 2023-01-19 LAB — CK: Total CK: 822 U/L — ABNORMAL HIGH (ref 49–397)

## 2023-01-19 LAB — RAPID URINE DRUG SCREEN, HOSP PERFORMED
Amphetamines: NOT DETECTED
Barbiturates: NOT DETECTED
Benzodiazepines: NOT DETECTED
Cocaine: POSITIVE — AB
Opiates: NOT DETECTED
Tetrahydrocannabinol: POSITIVE — AB

## 2023-01-19 LAB — URINALYSIS, ROUTINE W REFLEX MICROSCOPIC
Bilirubin Urine: NEGATIVE
Glucose, UA: NEGATIVE mg/dL
Hgb urine dipstick: NEGATIVE
Ketones, ur: NEGATIVE mg/dL
Leukocytes,Ua: NEGATIVE
Nitrite: NEGATIVE
Protein, ur: NEGATIVE mg/dL
Specific Gravity, Urine: 1.025 (ref 1.005–1.030)
pH: 5 (ref 5.0–8.0)

## 2023-01-19 LAB — BASIC METABOLIC PANEL
Anion gap: 6 (ref 5–15)
BUN: 10 mg/dL (ref 6–20)
CO2: 27 mmol/L (ref 22–32)
Calcium: 8.4 mg/dL — ABNORMAL LOW (ref 8.9–10.3)
Chloride: 110 mmol/L (ref 98–111)
Creatinine, Ser: 1.04 mg/dL (ref 0.61–1.24)
GFR, Estimated: >60 mL/min (ref >60)
Glucose, Bld: 117 mg/dL — ABNORMAL HIGH (ref 70–99)
Potassium: 4.2 mmol/L (ref 3.5–5.1)
Sodium: 143 mmol/L (ref 135–145)

## 2023-01-19 LAB — I-STAT CG4 LACTIC ACID, ED: Lactic Acid, Venous: 0.9 mmol/L (ref 0.5–1.9)

## 2023-01-19 LAB — AMMONIA: Ammonia: 35 umol/L (ref 9–35)

## 2023-01-19 LAB — CBG MONITORING, ED: Glucose-Capillary: 96 mg/dL (ref 70–99)

## 2023-01-19 LAB — ETHANOL: Alcohol, Ethyl (B): <10 mg/dL (ref <10)

## 2023-01-19 MED ORDER — IOHEXOL 350 MG/ML SOLN
75.0000 mL | Freq: Once | INTRAVENOUS | Status: AC | PRN
  Administered 2023-01-19: 75 mL via INTRAVENOUS

## 2023-01-19 MED ORDER — LEVETIRACETAM IN NACL 1000 MG/100ML IV SOLN
1000.0000 mg | Freq: Once | INTRAVENOUS | Status: AC
  Administered 2023-01-19: 1000 mg via INTRAVENOUS
  Filled 2023-01-19: qty 100

## 2023-01-19 MED ORDER — SODIUM CHLORIDE 0.9 % IV SOLN: 20.0000 mg/kg | Freq: Once | INTRAVENOUS | Status: DC

## 2023-01-19 MED ORDER — IOHEXOL 350 MG/ML SOLN
40.0000 mL | Freq: Once | INTRAVENOUS | Status: AC | PRN
  Administered 2023-01-19: 40 mL via INTRAVENOUS

## 2023-01-19 NOTE — ED Triage Notes (Signed)
Pt BIB [facility] from home. Per family, pt went to the bathroom, came back to sit on the couch and became unresponsive. On EMS arrival, pt only responsive to verbal stimuli.   Hx of seizure; family denies any seizure activity.
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
## 2023-01-19 NOTE — ED Notes (Signed)
Provider made aware of pts arrival to ED and at the bedside.

## 2023-01-19 NOTE — ED Provider Notes (Signed)
[place] EMERGENCY DEPARTMENT AT [facility] Provider Note   CSN: [id number] Arrival date & time: 01/19/23  1919  An emergency department physician performed an initial assessment on this suspected stroke patient at 2129.  History  Chief Complaint  Patient presents with   Altered Mental Status    Cory Henderson is a 47 y.o. male.  47 year old male with past medical history of hypertension, lupus, and remote history of epilepsy presenting to the emergency department today with altered mental status.  This apparently started just prior to arrival.  Medics reported that the patient walked back from the bathroom and sat down on the couch and has been hard to arouse since.  The patient is unable to provide any history.  He will arouse to painful stimuli and then will follow commands but is somewhat somnolent.   Altered Mental Status      Home Medications Prior to Admission medications   Medication Sig Start Date End Date Taking? Authorizing Provider  acetaminophen (TYLENOL) 325 MG tablet Take 2 tablets (650 mg total) by mouth every 6 (six) hours as needed for mild pain or moderate pain. 12/26/22   Philomena Doheny, MD  allopurinol (ZYLOPRIM) 300 MG tablet Take 300 mg by mouth daily. 09/17/22   [provider]  amLODipine (NORVASC) 10 MG tablet Take 10 mg by mouth daily. 07/26/21   [provider]  atorvastatin (LIPITOR) 40 MG tablet Take 40 mg by mouth daily. 08/01/21   [provider]  hydrOXYzine (ATARAX) 50 MG tablet Take 50 mg by mouth at bedtime. 09/16/21   [provider]  oxyCODONE (ROXICODONE) 5 MG immediate release tablet Take 1 tablet (5 mg total) by mouth every 4 (four) hours as needed for up to 6 doses for severe pain. 12/26/22   Gwenevere Abbot, MD  prazosin (MINIPRESS) 2 MG capsule Take 2 mg by mouth at bedtime. 06/30/22   [provider]  QUEtiapine (SEROQUEL) 400 MG tablet Take 400 mg by mouth at bedtime.  09/16/21   [provider]      Allergies    Fish allergy, Other, and Stearyl alcohol    Review of Systems   Review of Systems  Reason unable to perform ROS: Mental status.    Physical Exam Updated Vital Signs BP 108/68   Pulse (!) 57   Temp 97.8 F (36.6 C) (Oral)   Resp 12   SpO2 100%  Physical Exam Vitals and nursing note reviewed.   Gen: Somnolent Eyes: PERRL HEENT: no oropharyngeal swelling Neck: trachea midline, no meningismus Resp: clear to auscultation bilaterally Card: RRR, no murmurs, rubs, or gallops Abd: nontender, nondistended Extremities: no calf tenderness, no edema Vascular: 2+ radial pulses bilaterally, 2+ DP pulses bilaterally Neuro: The patient is somnolent but will arouse to painful stimuli as well as loud verbal stimuli, and he is slow to respond to commands but does seem to follow commands in all extremities Skin: no rashes Psyc: acting appropriately   ED Results / Procedures / Treatments   Labs (all labs ordered are listed, but only abnormal results are displayed) Labs Reviewed  BASIC METABOLIC PANEL - Abnormal; Notable for the following components:      Result Value   Glucose, Bld 117 (*)    Calcium 8.4 (*)    All other components within normal limits  CK - Abnormal; Notable for the following components:   Total CK 822 (*)    All other components within normal limits  RAPID URINE  DRUG SCREEN, HOSP PERFORMED - Abnormal; Notable for the following components:   Cocaine POSITIVE (*)    Tetrahydrocannabinol POSITIVE (*)    All other components within normal limits  CBC WITH DIFFERENTIAL/PLATELET  AMMONIA  URINALYSIS, ROUTINE W REFLEX MICROSCOPIC  ETHANOL  CBC WITH DIFFERENTIAL/PLATELET  COMPREHENSIVE METABOLIC PANEL  MAGNESIUM  MAGNESIUM  CBG MONITORING, ED  I-STAT CG4 LACTIC ACID, ED    EKG None  Radiology CT CEREBRAL PERFUSION W CONTRAST  Result Date: 01/19/2023 CLINICAL DATA:  Right arm weakness EXAM: CT PERFUSION  BRAIN TECHNIQUE: Multiphase CT imaging of the brain was performed following IV bolus contrast injection. Subsequent parametric perfusion maps were calculated using RAPID software. RADIATION DOSE REDUCTION: This exam was performed according to the departmental dose-optimization program which includes automated exposure control, adjustment of the mA and/or kV according to patient size and/or use of iterative reconstruction technique. CONTRAST:  40mL OMNIPAQUE IOHEXOL 350 MG/ML SOLN, 75mL OMNIPAQUE IOHEXOL 350 MG/ML SOLN COMPARISON:  None Available. FINDINGS: CT Brain Perfusion Findings: CBF (<30%) Volume: 0mL Perfusion (Tmax>6.0s) volume: 4mL Mismatch Volume: 4mL ASPECTS on noncontrast CT Head: 10 at 9:04 p.m. today. Infarct Core: 0 mL Infarction Location:No core infarct. The small area of calculated ischemic penumbra is located within the left frontal lobe, in the left ACA territory. IMPRESSION: 1. No core infarct. 2. Small area of calculated ischemic penumbra in the left frontal lobe, in the left ACA territory. Electronically Signed   By: Deatra Robinson M.D.   On: 01/19/2023 22:02   CT ANGIO HEAD NECK W WO CM (CODE STROKE)  Result Date: 01/19/2023 CLINICAL DATA:  Left facial droop.  Right-sided weakness. EXAM: CT ANGIOGRAPHY HEAD AND NECK WITH AND WITHOUT CONTRAST TECHNIQUE: Multidetector CT imaging of the head and neck was performed using the standard protocol during bolus administration of intravenous contrast. Multiplanar CT image reconstructions and MIPs were obtained to evaluate the vascular anatomy. Carotid stenosis measurements (when applicable) are obtained utilizing NASCET criteria, using the distal internal carotid diameter as the denominator. RADIATION DOSE REDUCTION: This exam was performed according to the departmental dose-optimization program which includes automated exposure control, adjustment of the mA and/or kV according to patient size and/or use of iterative reconstruction technique.  CONTRAST:  75mL OMNIPAQUE IOHEXOL 350 MG/ML SOLN COMPARISON:  Head CT same day FINDINGS: CTA NECK FINDINGS SKELETON: No acute abnormality or high grade bony spinal canal stenosis. OTHER NECK: Normal pharynx, larynx and major salivary glands. No cervical lymphadenopathy. Unremarkable thyroid gland. UPPER CHEST: No pneumothorax or pleural effusion. No nodules or masses. AORTIC ARCH: There is no calcific atherosclerosis of the aortic arch. Normal variant aortic arch branching pattern with the left vertebral artery arising independently from the aortic arch. RIGHT CAROTID SYSTEM: Normal without aneurysm, dissection or stenosis. LEFT CAROTID SYSTEM: Normal without aneurysm, dissection or stenosis. VERTEBRAL ARTERIES: Right dominant configuration. There is no dissection, occlusion or flow-limiting stenosis to the skull base (V1-V3 segments). CTA HEAD FINDINGS POSTERIOR CIRCULATION: --Vertebral arteries: Normal V4 segments. --Inferior cerebellar arteries: Normal. --Basilar artery: Normal. --Superior cerebellar arteries: Normal. --Posterior cerebral arteries (PCA): Normal proximally. Limited visualization beyond the proximal P2 segments due to streak artifacts caused by the patient's left arm. ANTERIOR CIRCULATION: --Intracranial internal carotid arteries: Normal. --Anterior cerebral arteries (ACA): Normal. --Middle cerebral arteries (MCA): Attenuation of the enhancement within the left MCA M1 segment is favored to be artifactual. Otherwise normal. VENOUS SINUSES: As permitted by contrast timing, patent. ANATOMIC VARIANTS: None Review of the MIP images confirms the above findings. IMPRESSION: 1. No  emergent large vessel occlusion or high-grade stenosis of the intracranial arteries. 2. Attenuated enhancement within the left MCA M1 segment is favored to be artifactual. These results were called by telephone at the time of interpretation on 01/19/2023 at 9:54 pm to provider [facility] , who verbally acknowledged these  results. Electronically Signed   By: Deatra Robinson M.D.   On: 01/19/2023 21:54   CT Head Wo Contrast  Result Date: 01/19/2023 CLINICAL DATA:  Memory loss and altered mental status EXAM: CT HEAD WITHOUT CONTRAST TECHNIQUE: Contiguous axial images were obtained from the base of the skull through the vertex without intravenous contrast. RADIATION DOSE REDUCTION: This exam was performed according to the departmental dose-optimization program which includes automated exposure control, adjustment of the mA and/or kV according to patient size and/or use of iterative reconstruction technique. COMPARISON:  10/29/2022 FINDINGS: Brain: No mass,hemorrhage or extra-axial collection. Normal appearance of the parenchyma and CSF spaces. Vascular: No hyperdense vessel or unexpected vascular calcification. Skull: The visualized skull base, calvarium and extracranial soft tissues are normal. Sinuses/Orbits: No fluid levels or advanced mucosal thickening of the visualized paranasal sinuses. No mastoid or middle ear effusion. Normal orbits. IMPRESSION: Normal head CT. Electronically Signed   By: Deatra Robinson M.D.   On: 01/19/2023 21:17   DG Chest Portable 1 View  Result Date: 01/19/2023 CLINICAL DATA:  Cough EXAM: PORTABLE CHEST 1 VIEW COMPARISON:  10/03/2021 FINDINGS: Low lung volumes with bibasilar atelectasis. No effusions or pneumothorax. Heart and mediastinal contours are within normal limits. No acute bony abnormality. IMPRESSION: Low lung volumes, bibasilar atelectasis. Electronically Signed   By: Charlett Nose M.D.   On: 01/19/2023 20:39    Procedures Procedures    Medications Ordered in ED Medications  acetaminophen (TYLENOL) tablet 650 mg (has no administration in time range)    Or  acetaminophen (TYLENOL) suppository 650 mg (has no administration in time range)  melatonin tablet 3 mg (has no administration in time range)  ondansetron (ZOFRAN) injection 4 mg (has no administration in time range)  iohexol  (OMNIPAQUE) 350 MG/ML injection 75 mL (75 mLs Intravenous Contrast Given 01/19/23 2139)  iohexol (OMNIPAQUE) 350 MG/ML injection 40 mL (40 mLs Intravenous Contrast Given 01/19/23 2149)  levETIRAcetam (KEPPRA) IVPB 1000 mg/100 mL premix (0 mg Intravenous Stopped 01/20/23 0014)    Followed by  levETIRAcetam (KEPPRA) IVPB 1000 mg/100 mL premix (0 mg Intravenous Stopped 01/20/23 0014)    ED Course/ Medical Decision Making/ A&P                                 Medical Decision Making 47 year old male with past medical history of lupus, hypertension, and epilepsy presenting to the emergency department today with altered mental status.  I will further evaluate patient here with basic labs to eval for electrolyte abnormalities in addition to a lactic acid to screen for possible seizure.  His vital signs does not suggest that he is septic at this time.  He has no meningismus here on exam this does seem to be relatively acute in origin here.  Obtain CT scan of his head to eval for intracranial hemorrhage or mass lesion.  Will obtain an alcohol level as well as UDS here.  I did call and discussed this case with neurology.  I do not appreciate any unilateral symptoms here so hold off on a stroke alert at this time.  They agreed with the initial workup and did  not have any further recommendations for his initial evaluation.  The patient's EKG interpreted by me shows a sinus rhythm with a rate of 61 with normal axis, normal intervals, nonspecific ST-T changes.  The patient's initial CT scan is unremarkable.  His labs were unrevealing.  I did reassess the patient he was more alert.  The patient does appear to have a mild left facial droop and seems to be favoring his left side more than his right.  For this reason I did call and discussed this with neurology and we did initiate a stroke code on the patient.  The patient was evaluated here.  There is concern that the symptoms may be due to seizure activity.  Admission is  recommended.  Given the concern for possible seizures being a more likely diagnosis TNK is not administered here.  Critical care time 42 minutes including reassessments, review of old records, coordination of care with neurology, coordination of care with hospitalist service  Amount and/or Complexity of Data Reviewed Labs: ordered. Radiology: ordered.  Risk Prescription drug management. Decision regarding hospitalization.           Final Clinical Impression(s) / ED Diagnoses Final diagnoses:  Altered mental status, unspecified altered mental status type    Rx / DC Orders ED Discharge Orders     None         Durwin Glaze, MD 01/20/23 [phone]
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
## 2023-01-20 ENCOUNTER — Inpatient Hospital Stay (HOSPITAL_COMMUNITY): Payer: Commercial Managed Care - HMO

## 2023-01-20 DIAGNOSIS — R7303 Prediabetes: Secondary | ICD-10-CM | POA: Diagnosis present

## 2023-01-20 DIAGNOSIS — E785 Hyperlipidemia, unspecified: Secondary | ICD-10-CM | POA: Diagnosis present

## 2023-01-20 DIAGNOSIS — G928 Other toxic encephalopathy: Secondary | ICD-10-CM | POA: Diagnosis present

## 2023-01-20 DIAGNOSIS — R4182 Altered mental status, unspecified: Secondary | ICD-10-CM

## 2023-01-20 DIAGNOSIS — R400 Somnolence: Secondary | ICD-10-CM | POA: Diagnosis present

## 2023-01-20 DIAGNOSIS — T40711A Poisoning by cannabis, accidental (unintentional), initial encounter: Secondary | ICD-10-CM | POA: Diagnosis present

## 2023-01-20 DIAGNOSIS — Z1152 Encounter for screening for COVID-19: Secondary | ICD-10-CM | POA: Diagnosis not present

## 2023-01-20 DIAGNOSIS — F17210 Nicotine dependence, cigarettes, uncomplicated: Secondary | ICD-10-CM | POA: Diagnosis present

## 2023-01-20 DIAGNOSIS — G9340 Encephalopathy, unspecified: Secondary | ICD-10-CM | POA: Diagnosis present

## 2023-01-20 DIAGNOSIS — N179 Acute kidney failure, unspecified: Secondary | ICD-10-CM | POA: Diagnosis present

## 2023-01-20 DIAGNOSIS — M6282 Rhabdomyolysis: Secondary | ICD-10-CM | POA: Diagnosis present

## 2023-01-20 DIAGNOSIS — F209 Schizophrenia, unspecified: Secondary | ICD-10-CM | POA: Diagnosis present

## 2023-01-20 DIAGNOSIS — F319 Bipolar disorder, unspecified: Secondary | ICD-10-CM | POA: Diagnosis present

## 2023-01-20 DIAGNOSIS — R011 Cardiac murmur, unspecified: Secondary | ICD-10-CM | POA: Diagnosis present

## 2023-01-20 DIAGNOSIS — F1410 Cocaine abuse, uncomplicated: Secondary | ICD-10-CM | POA: Diagnosis present

## 2023-01-20 DIAGNOSIS — F419 Anxiety disorder, unspecified: Secondary | ICD-10-CM | POA: Diagnosis present

## 2023-01-20 DIAGNOSIS — T405X1A Poisoning by cocaine, accidental (unintentional), initial encounter: Secondary | ICD-10-CM | POA: Diagnosis present

## 2023-01-20 DIAGNOSIS — F4310 Post-traumatic stress disorder, unspecified: Secondary | ICD-10-CM | POA: Diagnosis present

## 2023-01-20 DIAGNOSIS — R569 Unspecified convulsions: Secondary | ICD-10-CM | POA: Diagnosis present

## 2023-01-20 DIAGNOSIS — I10 Essential (primary) hypertension: Secondary | ICD-10-CM | POA: Diagnosis present

## 2023-01-20 DIAGNOSIS — R471 Dysarthria and anarthria: Secondary | ICD-10-CM | POA: Diagnosis present

## 2023-01-20 DIAGNOSIS — F1210 Cannabis abuse, uncomplicated: Secondary | ICD-10-CM | POA: Diagnosis present

## 2023-01-20 DIAGNOSIS — I6602 Occlusion and stenosis of left middle cerebral artery: Secondary | ICD-10-CM | POA: Diagnosis present

## 2023-01-20 DIAGNOSIS — I67841 Reversible cerebrovascular vasoconstriction syndrome: Secondary | ICD-10-CM | POA: Diagnosis present

## 2023-01-20 DIAGNOSIS — Y9289 Other specified places as the place of occurrence of the external cause: Secondary | ICD-10-CM | POA: Diagnosis not present

## 2023-01-20 DIAGNOSIS — R29810 Facial weakness: Secondary | ICD-10-CM | POA: Diagnosis present

## 2023-01-20 DIAGNOSIS — R4701 Aphasia: Secondary | ICD-10-CM | POA: Diagnosis present

## 2023-01-20 LAB — URINALYSIS, COMPLETE (UACMP) WITH MICROSCOPIC
Bilirubin Urine: NEGATIVE
Glucose, UA: NEGATIVE mg/dL
Ketones, ur: NEGATIVE mg/dL
Leukocytes,Ua: NEGATIVE
Nitrite: NEGATIVE
Protein, ur: NEGATIVE mg/dL
Specific Gravity, Urine: 1.025 (ref 1.005–1.030)
pH: 6 (ref 5.0–8.0)

## 2023-01-20 LAB — COMPREHENSIVE METABOLIC PANEL
ALT: 21 U/L (ref 0–44)
AST: 19 U/L (ref 15–41)
Albumin: 2.7 g/dL — ABNORMAL LOW (ref 3.5–5.0)
Alkaline Phosphatase: 43 U/L (ref 38–126)
Anion gap: 7 (ref 5–15)
BUN: 7 mg/dL (ref 6–20)
CO2: 25 mmol/L (ref 22–32)
Calcium: 8.5 mg/dL — ABNORMAL LOW (ref 8.9–10.3)
Chloride: 110 mmol/L (ref 98–111)
Creatinine, Ser: 1.01 mg/dL (ref 0.61–1.24)
GFR, Estimated: >60 mL/min (ref >60)
Glucose, Bld: 106 mg/dL — ABNORMAL HIGH (ref 70–99)
Potassium: 4 mmol/L (ref 3.5–5.1)
Sodium: 142 mmol/L (ref 135–145)
Total Bilirubin: 0.4 mg/dL (ref <1.2)
Total Protein: 5.4 g/dL — ABNORMAL LOW (ref 6.5–8.1)

## 2023-01-20 LAB — CBC WITH DIFFERENTIAL/PLATELET
Abs Immature Granulocytes: 0.03 10*3/uL (ref 0.00–0.07)
Basophils Absolute: 0 10*3/uL (ref 0.0–0.1)
Basophils Relative: 0 %
Eosinophils Absolute: 0.2 10*3/uL (ref 0.0–0.5)
Eosinophils Relative: 3 %
HCT: 37.3 % — ABNORMAL LOW (ref 39.0–52.0)
Hemoglobin: 12.1 g/dL — ABNORMAL LOW (ref 13.0–17.0)
Immature Granulocytes: 0 %
Lymphocytes Relative: 24 %
Lymphs Abs: 1.9 10*3/uL (ref 0.7–4.0)
MCH: 30.2 pg (ref 26.0–34.0)
MCHC: 32.4 g/dL (ref 30.0–36.0)
MCV: 93 fL (ref 80.0–100.0)
Monocytes Absolute: 0.6 10*3/uL (ref 0.1–1.0)
Monocytes Relative: 7 %
Neutro Abs: 5.3 10*3/uL (ref 1.7–7.7)
Neutrophils Relative %: 66 %
Platelets: 274 10*3/uL (ref 150–400)
RBC: 4.01 MIL/uL — ABNORMAL LOW (ref 4.22–5.81)
RDW: 13.6 % (ref 11.5–15.5)
WBC: 8.1 10*3/uL (ref 4.0–10.5)
nRBC: 0 % (ref 0.0–0.2)

## 2023-01-20 LAB — MAGNESIUM: Magnesium: 2.1 mg/dL (ref 1.7–2.4)

## 2023-01-20 MED ORDER — HYDROXYZINE HCL 25 MG PO TABS: 25.0000 mg | ORAL_TABLET | Freq: Four times a day (QID) | ORAL | Status: DC | PRN

## 2023-01-20 MED ORDER — ATORVASTATIN CALCIUM 40 MG PO TABS
40.0000 mg | ORAL_TABLET | Freq: Every day | ORAL | Status: DC
  Administered 2023-01-20 – 2023-01-22 (×3): 40 mg via ORAL
  Filled 2023-01-20 (×3): qty 1

## 2023-01-20 MED ORDER — MIDAZOLAM HCL 2 MG/2ML IJ SOLN: 1.0000 mg | INTRAMUSCULAR | Status: DC | PRN

## 2023-01-20 MED ORDER — ONDANSETRON HCL 4 MG/2ML IJ SOLN: 4.0000 mg | Freq: Four times a day (QID) | INTRAMUSCULAR | Status: DC | PRN

## 2023-01-20 MED ORDER — MELATONIN 3 MG PO TABS: 3.0000 mg | ORAL_TABLET | Freq: Every evening | ORAL | Status: DC | PRN

## 2023-01-20 MED ORDER — ACETAMINOPHEN 650 MG RE SUPP: 650.0000 mg | Freq: Four times a day (QID) | RECTAL | Status: DC | PRN

## 2023-01-20 MED ORDER — PHENOL 1.4 % MT LIQD: 1.0000 | OROMUCOSAL | Status: DC | PRN

## 2023-01-20 MED ORDER — ACETAMINOPHEN 325 MG PO TABS: 650.0000 mg | ORAL_TABLET | Freq: Four times a day (QID) | ORAL | Status: DC | PRN

## 2023-01-20 MED ORDER — PRAZOSIN HCL 2 MG PO CAPS
2.0000 mg | ORAL_CAPSULE | Freq: Every day | ORAL | Status: DC
Start: 2023-01-20 — End: 2023-01-22
  Administered 2023-01-20 – 2023-01-21 (×2): 2 mg via ORAL
  Filled 2023-01-20 (×3): qty 1

## 2023-01-20 MED ORDER — RIVAROXABAN 10 MG PO TABS
10.0000 mg | ORAL_TABLET | Freq: Every day | ORAL | Status: DC
  Administered 2023-01-20 – 2023-01-22 (×3): 10 mg via ORAL
  Filled 2023-01-20 (×3): qty 1

## 2023-01-20 MED ORDER — QUETIAPINE FUMARATE 200 MG PO TABS
400.0000 mg | ORAL_TABLET | Freq: Every day | ORAL | Status: DC
  Administered 2023-01-20 – 2023-01-21 (×2): 400 mg via ORAL
  Filled 2023-01-20 (×2): qty 2

## 2023-01-20 NOTE — Procedures (Signed)
Patient Name: Cory Henderson  MRN: [id number]  Epilepsy Attending: Charlsie Quest  Referring Physician/Provider: Erick Blinks, MD  Duration: 01/20/2023 0420 to 01/21/2023 0420  Patient history: 47 yo M. Went to bathroom and came back and sat on couch and was not very responsive and somnolent. EMS called and brought in to the ED where with vigorous stimulation, he will make eye contact and oriented to self and follow commands, moving all extremities but unable to provide much history. EEG to evaluate for seizure  Level of alertness: Awake, asleep  AEDs during EEG study: LEV  Technical aspects: This EEG study was done with scalp electrodes positioned according to the 10-20 International system of electrode placement. Electrical activity was reviewed with band pass filter of 1-70Hz , sensitivity of 7 uV/mm, display speed of 61mm/sec with a 60Hz  notched filter applied as appropriate. EEG data were recorded continuously and digitally stored.  Video monitoring was available and reviewed as appropriate.  Description: The posterior dominant rhythm consists of 7.5 Hz activity of moderate voltage (25-35 uV) seen predominantly in posterior head regions, symmetric and reactive to eye opening and eye closing. Sleep was characterized by vertex waves, sleep spindles (12 to 14 Hz), maximal frontocentral region. Hyperventilation and photic stimulation were not performed.     IMPRESSION: This study is within normal limits. No seizures or epileptiform discharges were seen throughout the recording.  A normal interictal EEG does not exclude the diagnosis of epilepsy.  Cory Henderson

## 2023-01-20 NOTE — Progress Notes (Signed)
Patient not available for EEG lead placement at the moment. Pt will be going to MRI shortly , will check back for availability, as schedule allows.

## 2023-01-20 NOTE — Progress Notes (Signed)
Patient moved to 3W02 from ED 44.

## 2023-01-20 NOTE — ED Notes (Signed)
Girlfriend at bedside.
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
## 2023-01-20 NOTE — ED Notes (Signed)
Xray tech at bedside.

## 2023-01-20 NOTE — ED Notes (Signed)
Patient transported to MRI 

## 2023-01-20 NOTE — Progress Notes (Addendum)
LTM EEG hooked up and running - no initial skin breakdown - push button tested - Not monitored by [facility] while in ED.  Leads glued and head wrap applied due to pt rubbing head and forehead often.

## 2023-01-20 NOTE — Progress Notes (Signed)
  Carryover admission to the Day Admitter.  I discussed this case with the EDP, Dr. Beckey Downing.  Per these discussions:   This is a 47 year old male with a remote history of seizures, who is being admitted with acute encephalopathy, with concern for contributory seizures, after presenting with altered mental status.  He has gradually woken up relative to his initial presentation, with EDP conveying ensuing inconsistent neuroexam, with fluctuating right-sided versus left-sided weakness.  No overt tonic-clonic activity observed.  EDP discussed patient's case with on-call neurology, Dr. Derry Lory , Who is formally consulting.  There is concern for potential underlying seizures.  Dr. Derry Lory has ordered Keppra load as well as overnight EEG with video, as well as MRI of the brain.  I have placed an order for inpatient admission to the PCU for further evaluation and management of the above.  I have placed some additional preliminary admit orders via the adult multi-morbid admission order set. I have also ordered n.p.o., added on a serum magnesium level, and ordered seizure precautions.  I have also ordered acute 4-hour neurochecks x 3 occurrences, and ordered morning labs in the form of CMP, CBC, and serum magnesium level.    Newton Pigg, DO Hospitalist

## 2023-01-20 NOTE — ED Notes (Signed)
Pt returned back to room from MRI.
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
## 2023-01-20 NOTE — ED Notes (Signed)
Assumed care of pt at this time.

## 2023-01-20 NOTE — Progress Notes (Addendum)
Subjective: No further seizure-like activity.  Denies any concerns.  Denies cocaine use.  Does not remember anything from yesterday.  States his sister helps him with medications.  Also states he was bit by a spider about 2 weeks ago on his left index finger and had some local swelling but denies any weakness, tingling, numbness.  ROS: negative except above Examination  Vital signs in last 24 hours: Temp:  [97.8 F (36.6 C)-98.1 F (36.7 C)] 98 F (36.7 C) (11/05 0843) Pulse Rate:  [55-65] 58 (11/05 0800) Resp:  [0-19] 12 (11/05 0800) BP: (102-133)/(63-84) 108/68 (11/05 0800) SpO2:  [94 %-100 %] 98 % (11/05 0800)  General: lying in bed, NAD Neuro: MS: Alert, oriented, follows commands CN: pupils equal and reactive,  EOMI, face symmetric, tongue midline, normal sensation over face, Motor: 5/5 strength in all 4 extremities Coordination: normal Gait: not tested  Basic Metabolic Panel: Recent Labs  Lab 01/19/23 1929 01/20/23 0625  NA 143 142  K 4.2 4.0  CL 110 110  CO2 27 25  GLUCOSE 117* 106*  BUN 10 7  CREATININE 1.04 1.01  CALCIUM 8.4* 8.5*  MG  --  2.1    CBC: Recent Labs  Lab 01/19/23 1929 01/20/23 0625  WBC 7.7 8.1  NEUTROABS 4.3 5.3  HGB 13.0 12.1*  HCT 39.5 37.3*  MCV 91.6 93.0  PLT 328 274     Coagulation Studies: No results for input(s): "LABPROT", "INR" in the last 72 hours.  Imaging personally reviewed MRI brain without contrast 01/20/2023: Severely motion degraded examination consisting only of axial diffusion-weighted imaging. No acute ischemia is visible.  CTA head and neck with and without contrast 01/19/2023: No emergent large vessel occlusion or high-grade stenosis of the intracranial arteries. Attenuated enhancement within the left MCA M1 segment is favored to be artifactual.  ASSESSMENT AND PLAN: 47 year old male with prior medical history of hypertension, hyperlipidemia, schizophrenia, bipolar disorder, prior history of seizure-like  episode in the setting of taking Seroquel and drinking tequila, cocaine use disorder who presented with transient alteration of awareness.  Transient alteration of awareness Cocaine use disorder Cannabis use disorder Rhabdomyolysis -Likely secondary to substance abuse vs reversible vasoconstrictive syndrome ( RCVS) due to cocaine use (did have left M1 stenosis on CTA which could explain right-sided weakness and aphasia ) versus provoked seizure in the setting of cocaine use  Recommendations -Attempted to call patient's sister on 01/20/2023 at around 10 AM.  Phone went to voicemail.  Will try again later -Will continue video EEG monitoring for 24 more hours.  Unless there is definite epileptogenic abnormality on EEG, do not recommend starting antiseizure medications -Okay to resume home medications -Counseled against substance use -Continue seizure precautions -As needed IV Versed for clinical seizure -Management of comorbidities per primary team  I have spent a total of 36   minutes with the patient reviewing hospital notes,  test results, labs and examining the patient as well as establishing an assessment and plan that was discussed personally with the patient.  > 50% of time was spent in direct patient care.       Lindie Spruce Epilepsy [facility] For questions after 5pm please refer to AMION to reach the Neurologist on call

## 2023-01-20 NOTE — ED Notes (Signed)
Report received from Armanda Heritage RN. Assumed care of pt at this time.

## 2023-01-21 DIAGNOSIS — R4182 Altered mental status, unspecified: Secondary | ICD-10-CM | POA: Diagnosis not present

## 2023-01-21 DIAGNOSIS — R569 Unspecified convulsions: Secondary | ICD-10-CM | POA: Diagnosis not present

## 2023-01-21 DIAGNOSIS — G9340 Encephalopathy, unspecified: Secondary | ICD-10-CM | POA: Diagnosis not present

## 2023-01-21 LAB — RESPIRATORY PANEL BY PCR

## 2023-01-21 LAB — BASIC METABOLIC PANEL
Anion gap: 6 (ref 5–15)
Anion gap: 9 (ref 5–15)
BUN: 19 mg/dL (ref 6–20)
BUN: 20 mg/dL (ref 6–20)
CO2: 26 mmol/L (ref 22–32)
CO2: 28 mmol/L (ref 22–32)
Calcium: 8.1 mg/dL — ABNORMAL LOW (ref 8.9–10.3)
Calcium: 8.2 mg/dL — ABNORMAL LOW (ref 8.9–10.3)
Chloride: 108 mmol/L (ref 98–111)
Chloride: 105 mmol/L (ref 98–111)
Creatinine, Ser: 1.4 mg/dL — ABNORMAL HIGH (ref 0.61–1.24)
Creatinine, Ser: 1.39 mg/dL — ABNORMAL HIGH (ref 0.61–1.24)
GFR, Estimated: >60 mL/min (ref >60)
GFR, Estimated: >60 mL/min (ref >60)
Glucose, Bld: 99 mg/dL (ref 70–99)
Glucose, Bld: 128 mg/dL — ABNORMAL HIGH (ref 70–99)
Potassium: 3.6 mmol/L (ref 3.5–5.1)
Potassium: 3.8 mmol/L (ref 3.5–5.1)
Sodium: 140 mmol/L (ref 135–145)
Sodium: 142 mmol/L (ref 135–145)

## 2023-01-21 LAB — CBC
HCT: 33.8 % — ABNORMAL LOW (ref 39.0–52.0)
Hemoglobin: 11.2 g/dL — ABNORMAL LOW (ref 13.0–17.0)
MCH: 30.3 pg (ref 26.0–34.0)
MCHC: 33.1 g/dL (ref 30.0–36.0)
MCV: 91.4 fL (ref 80.0–100.0)
Platelets: 268 10*3/uL (ref 150–400)
RBC: 3.7 MIL/uL — ABNORMAL LOW (ref 4.22–5.81)
RDW: 13.3 % (ref 11.5–15.5)
WBC: 7.4 10*3/uL (ref 4.0–10.5)
nRBC: 0 % (ref 0.0–0.2)

## 2023-01-21 LAB — SARS CORONAVIRUS 2 BY RT PCR: SARS Coronavirus 2 by RT PCR: NEGATIVE

## 2023-01-21 LAB — CK: Total CK: 290 U/L (ref 49–397)

## 2023-01-21 LAB — GLUCOSE, CAPILLARY: Glucose-Capillary: 132 mg/dL — ABNORMAL HIGH (ref 70–99)

## 2023-01-21 MED ORDER — DEXTROSE 5 % AND 0.9 % NACL IV BOLUS: 1000.0000 mL | Freq: Once | INTRAVENOUS | Status: DC

## 2023-01-21 MED ORDER — SODIUM CHLORIDE 0.9 % IV BOLUS
500.0000 mL | Freq: Once | INTRAVENOUS | Status: AC
  Administered 2023-01-21: 500 mL via INTRAVENOUS

## 2023-01-21 NOTE — Discharge Instructions (Addendum)
You were hospitalized for altered mental status. Thank you for allowing Cory Henderson to be part of your care.   We arranged for you to follow up at:  02/10/2023 8:45 AM [name], MD [facility] [place]  We have not made any changes to your medications.  Seizure precautions: Per [facility] statutes, patients with seizures are not allowed to drive until they have been seizure-free for six months and cleared by a physician    Use caution when using heavy equipment or power tools. Avoid working on ladders or at heights. Take showers instead of baths. Ensure the water temperature is not too high on the home water heater. Do not go swimming alone. Do not lock yourself in a room alone (i.e. bathroom). When caring for infants or small children, sit down when holding, feeding, or changing them to minimize risk of injury to the child in the event you have a seizure. Maintain good sleep hygiene. Avoid alcohol.    If patient has another seizure, call 911 and bring them back to the ED if: A.  The seizure lasts longer than 5 minutes.      B.  The patient doesn't wake shortly after the seizure or has new problems such as difficulty seeing, speaking or moving following the seizure C.  The patient was injured during the seizure D.  The patient has a temperature over 102 F (39C) E.  The patient vomited during the seizure and now is having trouble breathing    During the Seizure   - First, ensure adequate ventilation and place patients on the floor on their left side  Loosen clothing around the neck and ensure the airway is patent. If the patient is clenching the teeth, do not force the mouth open with any object as this can cause severe damage - Remove all items from the surrounding that can be hazardous. The patient may be oblivious to what's happening and may not even know what he or she is doing. If the patient is confused and wandering,  either gently guide him/her away and block access to outside areas - Reassure the individual and be comforting - Call 911. In most cases, the seizure ends before EMS arrives. However, there are cases when seizures may last over 3 to 5 minutes. Or the individual may have developed breathing difficulties or severe injuries. If a pregnant patient or a person with diabetes develops a seizure, it is prudent to call an ambulance.    After the Seizure (Postictal Stage)   After a seizure, most patients experience confusion, fatigue, muscle pain and/or a headache. Thus, one should permit the individual to sleep. For the next few days, reassurance is essential. Being calm and helping reorient the person is also of importance.   Most seizures are painless and end spontaneously. Seizures are not harmful to others but can lead to complications such as stress on the lungs, brain and the heart. Individuals with prior lung problems may develop labored breathing and respiratory distress.      Please call our clinic if you have any questions or concerns, we may be able to help and keep you from a long and expensive emergency room wait. Our clinic and after hours phone number is [phone], the best time to call is Monday through Friday 9 am to 4 pm but there is always someone available 24/7 if you have an emergency. If you need medication refills please  notify your pharmacy one week in advance and they will send Cory Henderson a request.

## 2023-01-21 NOTE — Plan of Care (Signed)
  Problem: Education: Goal: Knowledge of General Education information will improve Description Including pain rating scale, medication(s)/side effects and non-pharmacologic comfort measures Outcome: Progressing   

## 2023-01-21 NOTE — Evaluation (Signed)
Physical Therapy Brief Evaluation and Discharge Note Patient Details Name: Cory Henderson MRN: [id number] DOB: 01-10-1976 Today's Date: 01/21/2023   History of Present Illness  Pt is 47 year old presented to [facility] on  01/20/23 for AMS. Pt with acute encephalopathy with concern for seizures. Pt +cocaine. PMH - HTN, PTSD, bipolar disorder, schizophrenia  Clinical Impression  Pt doing well with mobility and close to baseline. No further PT needed.  Ready for dc from PT standpoint.        PT Assessment Patient does not need any further PT services  Assistance Needed at Discharge  PRN    Equipment Recommendations None recommended by PT  Recommendations for Other Services       Precautions/Restrictions Restrictions Weight Bearing Restrictions: No        Mobility  Bed Mobility       General bed mobility comments: Pt sitting EOB  Transfers Overall transfer level: Modified independent Equipment used: None                    Ambulation/Gait Ambulation/Gait assistance: Supervision Gait Distance (Feet): 225 Feet Assistive device: None Gait Pattern/deviations: Drifts right/left Gait Speed: Below normal General Gait Details: Slight drift with gait but no instability. Supervision for safety  Home Activity Instructions    Stairs            Modified Rankin (Stroke Patients Only)        Balance Overall balance assessment: Mild deficits observed, not formally tested                        Pertinent Vitals/Pain   Pain Assessment Pain Assessment: No/denies pain     Home Living Family/patient expects to be discharged to:: Private residence Living Arrangements: Other relatives;Parent Available Help at Discharge: Family Home Environment: Level entry   Home Equipment: None        Prior Function Level of Independence: Independent      UE/LE Assessment   UE ROM/Strength/Tone/Coordination: WFL    LE ROM/Strength/Tone/Coordination:  [facility]      Communication   Communication Communication: No apparent difficulties     Cognition Overall Cognitive Status: No family/caregiver present to determine baseline cognitive functioning       General Comments      Exercises     Assessment/Plan    PT Problem List         PT Visit Diagnosis Unsteadiness on feet (R26.81)    No Skilled PT Patient is supervision for all activity/mobility   Co-evaluation                AMPAC 6 Clicks Help needed turning from your back to your side while in a flat bed without using bedrails?: None Help needed moving from lying on your back to sitting on the side of a flat bed without using bedrails?: None Help needed moving to and from a bed to a chair (including a wheelchair)?: None Help needed standing up from a chair using your arms (e.g., wheelchair or bedside chair)?: None Help needed to walk in hospital room?: A Little Help needed climbing 3-5 steps with a railing? : A Little 6 Click Score: 22      End of Session   Activity Tolerance: Patient tolerated treatment well Patient left: in bed;with bed alarm set;with call bell/phone within reach (sitting EOB) Nurse Communication: Mobility status PT Visit Diagnosis: Unsteadiness on feet (R26.81)     Time: 1191-4782 PT Time Calculation (  min) (ACUTE ONLY): 19 min  Charges:   PT Evaluation $PT Eval Low Complexity: 1 Low      [facility] PT Acute Rehabilitation Services Office [phone]   Angelina Ok [facility]  01/21/2023, 11:58 AM

## 2023-01-21 NOTE — Procedures (Addendum)
Patient Name: Cory Henderson  MRN: [id number]  Epilepsy Attending: Charlsie Quest  Referring Physician/Provider: Erick Blinks, MD  Duration: 01/21/2023 0420 to 01/21/2023 1015   Patient history: 47 yo M. Went to bathroom and came back and sat on couch and was not very responsive and somnolent. EMS called and brought in to the ED where with vigorous stimulation, he will make eye contact and oriented to self and follow commands, moving all extremities but unable to provide much history. EEG to evaluate for seizure   Level of alertness: Awake, asleep   AEDs during EEG study: None   Technical aspects: This EEG study was done with scalp electrodes positioned according to the 10-20 International system of electrode placement. Electrical activity was reviewed with band pass filter of 1-70Hz , sensitivity of 7 uV/mm, display speed of 87mm/sec with a 60Hz  notched filter applied as appropriate. EEG data were recorded continuously and digitally stored.  Video monitoring was available and reviewed as appropriate.   Description: The posterior dominant rhythm consists of 7.5 Hz activity of moderate voltage (25-35 uV) seen predominantly in posterior head regions, symmetric and reactive to eye opening and eye closing. Sleep was characterized by vertex waves, sleep spindles (12 to 14 Hz), maximal frontocentral region. Hyperventilation and photic stimulation were not performed.      IMPRESSION: This study is within normal limits. No seizures or epileptiform discharges were seen throughout the recording.   A normal interictal EEG does not exclude the diagnosis of epilepsy.   Ousmane Seeman Annabelle Harman

## 2023-01-21 NOTE — Progress Notes (Signed)
Subjective: No acute events overnight.  Denies any concerns.  ROS: negative except above  Examination  Vital signs in last 24 hours: Temp:  [97.7 F (36.5 C)-98.8 F (37.1 C)] 98.1 F (36.7 C) (11/06 0848) Pulse Rate:  [58-83] 77 (11/06 0848) Resp:  [11-20] 17 (11/06 0848) BP: (79-129)/(47-75) 99/67 (11/06 0848) SpO2:  [95 %-100 %] 98 % (11/06 0848)  General: lying in bed, NAD Neuro: MS: Alert, oriented, follows commands CN: pupils equal and reactive,  EOMI, face symmetric, tongue midline, normal sensation over face, Motor: 5/5 strength in all 4 extremities Coordination: normal Gait: not tested  Basic Metabolic Panel: Recent Labs  Lab 01/19/23 1929 01/20/23 0625 01/21/23 0430  NA 143 142 140  K 4.2 4.0 3.6  CL 110 110 108  CO2 27 25 26   GLUCOSE 117* 106* 99  BUN 10 7 19   CREATININE 1.04 1.01 1.40*  CALCIUM 8.4* 8.5* 8.1*  MG  --  2.1  --     CBC: Recent Labs  Lab 01/19/23 1929 01/20/23 0625 01/21/23 0430  WBC 7.7 8.1 7.4  NEUTROABS 4.3 5.3  --   HGB 13.0 12.1* 11.2*  HCT 39.5 37.3* 33.8*  MCV 91.6 93.0 91.4  PLT 328 274 268     Coagulation Studies: No results for input(s): "LABPROT", "INR" in the last 72 hours.  Imaging No new brain imaging overnight   ASSESSMENT AND PLAN:47 year old male with prior medical history of hypertension, hyperlipidemia, schizophrenia, bipolar disorder, prior history of seizure-like episode in the setting of taking Seroquel and drinking tequila, cocaine use disorder who presented with transient alteration of awareness.   Transient alteration of awareness Cocaine use disorder Cannabis use disorder Rhabdomyolysis ( resolved) -Likely secondary to substance abuse vs reversible vasoconstrictive syndrome ( RCVS) due to cocaine use (did have left M1 stenosis on CTA which could explain right-sided weakness and aphasia ) versus provoked seizure in the setting of cocaine use   Recommendations -DC LTM EEG as no ictal-interictal  abnormality. -As these could have been provoked seizure in the setting of cocaine use, with normal EEG and MRI, will hold off on starting antiseizure medications for now -Continue home medications -Counseled against substance use -Continue seizure precautions.  Discussed seizure precautions for 6 months due to alteration of awareness  -As needed IV Versed for clinical seizure -Management of comorbidities per primary team -Management of rest of comorbidities per primary team  Seizure precautions: Per [facility] statutes, patients with seizures are not allowed to drive until they have been seizure-free for six months and cleared by a physician    Use caution when using heavy equipment or power tools. Avoid working on ladders or at heights. Take showers instead of baths. Ensure the water temperature is not too high on the home water heater. Do not go swimming alone. Do not lock yourself in a room alone (i.e. bathroom). When caring for infants or small children, sit down when holding, feeding, or changing them to minimize risk of injury to the child in the event you have a seizure. Maintain good sleep hygiene. Avoid alcohol.    If patient has another seizure, call 911 and bring them back to the ED if: A.  The seizure lasts longer than 5 minutes.      B.  The patient doesn't wake shortly after the seizure or has new problems such as difficulty seeing, speaking or moving following the seizure C.  The patient was injured during the seizure D.  The patient has  a temperature over 102 F (39C) E.  The patient vomited during the seizure and now is having trouble breathing    During the Seizure   - First, ensure adequate ventilation and place patients on the floor on their left side  Loosen clothing around the neck and ensure the airway is patent. If the patient is clenching the teeth, do not force the mouth open with any object as this can cause severe damage - Remove all items from the  surrounding that can be hazardous. The patient may be oblivious to what's happening and may not even know what he or she is doing. If the patient is confused and wandering, either gently guide him/her away and block access to outside areas - Reassure the individual and be comforting - Call 911. In most cases, the seizure ends before EMS arrives. However, there are cases when seizures may last over 3 to 5 minutes. Or the individual may have developed breathing difficulties or severe injuries. If a pregnant patient or a person with diabetes develops a seizure, it is prudent to call an ambulance.    After the Seizure (Postictal Stage)   After a seizure, most patients experience confusion, fatigue, muscle pain and/or a headache. Thus, one should permit the individual to sleep. For the next few days, reassurance is essential. Being calm and helping reorient the person is also of importance.   Most seizures are painless and end spontaneously. Seizures are not harmful to others but can lead to complications such as stress on the lungs, brain and the heart. Individuals with prior lung problems may develop labored breathing and respiratory distress.      I have spent a total of 38 minutes with the patient reviewing hospital notes,  test results, labs and examining the patient as well as establishing an assessment and plan that was discussed personally with the patient.  > 50% of time was spent in direct patient care.    Lindie Spruce Epilepsy [facility] For questions after 5pm please refer to AMION to reach the Neurologist on call

## 2023-01-21 NOTE — Progress Notes (Addendum)
HD#1 Subjective:   Summary: Cory Henderson is a 47 y.o. male with pertinent PMH of  substance use disorder, hypertension, hyperlipidemia, prior seizure, bipolar, and PTSD who presented to the ED with altered mental status.   Overnight Events: 500 cc normal saline bolus  Patient seen today on rounds, still unclear what happened prior to him coming the hospital.  He says the last thing remembers is being in bed with his dog at night, followed by waking up in the hospital.  The patient does endorse THC use as well as the use of crack cocaine.  He does state he has had a seizure in the past. Good appetite today, still perhaps a bit lethargic but otherwise doing well.   Objective:  Vital signs in last 24 hours: Vitals:   01/21/23 0340 01/21/23 0448 01/21/23 0848 01/21/23 1139  BP: (!) 79/47 (!) 86/58 99/67 (!) 143/71  Pulse: 66 65 77 74  Resp: 16  17 18   Temp: 98 F (36.7 C)  98.1 F (36.7 C) 97.9 F (36.6 C)  TempSrc: Oral  Oral Oral  SpO2: 95%  98% 96%   Supplemental O2: Room Air SpO2: 96 %   Physical Exam:  Constitutional: Well-appearing, in no acute distress Cardiovascular: 2 out of 6 systolic murmur heard at the tricuspid area.  Pulmonary/Chest: normal work of breathing on room air, lungs clear to auscultation bilaterally Abdominal: soft, non-tender, non-distended, positive bowel sounds MSK: normal bulk and tone Neurological: alert & oriented x 3 Skin: warm and dry Psych: Normal mood and affect, linear speech  There were no vitals filed for this visit.    Intake/Output Summary (Last 24 hours) at 01/21/2023 1452 Last data filed at 01/20/2023 1800 Gross per 24 hour  Intake 480 ml  Output --  Net 480 ml   Net IO Since Admission: 680 mL [01/21/23 1452]  Pertinent Labs:    Latest Ref Rng & Units 01/21/2023    4:30 AM 01/20/2023    6:25 AM 01/19/2023    7:29 PM  CBC  WBC 4.0 - 10.5 K/uL 7.4  8.1  7.7   Hemoglobin 13.0 - 17.0 g/dL 65.7  84.6  96.2    Hematocrit 39.0 - 52.0 % 33.8  37.3  39.5   Platelets 150 - 400 K/uL 268  274  328        Latest Ref Rng & Units 01/21/2023    2:03 PM 01/21/2023    4:30 AM 01/20/2023    6:25 AM  CMP  Glucose 70 - 99 mg/dL 952  99  841   BUN 6 - 20 mg/dL 20  19  7    Creatinine 0.61 - 1.24 mg/dL 3.24  4.01  0.27   Sodium 135 - 145 mmol/L 142  140  142   Potassium 3.5 - 5.1 mmol/L 3.8  3.6  4.0   Chloride 98 - 111 mmol/L 105  108  110   CO2 22 - 32 mmol/L 28  26  25    Calcium 8.9 - 10.3 mg/dL 8.2  8.1  8.5   Total Protein 6.5 - 8.1 g/dL   5.4   Total Bilirubin <1.2 mg/dL   0.4   Alkaline Phos 38 - 126 U/L   43   AST 15 - 41 U/L   19   ALT 0 - 44 U/L   21     Assessment/Plan:   Principal Problem:   Acute encephalopathy   Acute encephalopathy with concern for seizures Per  neurology likely due to substance use versus seizure versus reversible vasoconstriction syndrome.  No indication for antiseizure medication at this time. Plan: - Appreciate neurology input -No medication changes plan currently -Will continue to discuss abstaining from substance use  AKI: Baseline creatinine around 1.0, and at time of admission.  Creatinine this a.m. 1.4, 1.39 on repeat this p.m.  Was hypotensive overnight which responded to fluid.  Differential would include rhabdomyolysis, versus prerenal etiology. Plan: -Status post 1 L normal saline, additional fluid as needed -Daily BMP  URI symptoms Patient endorsing URI symptoms such as cough, congestion, fevers in the past.  States this is resolving.   Plan: - COVID/respiratory PCR  Hypotension Patient hypotensive overnight, unclear etiology though likely due to volume depletion.  Improved with normal saline bolus.  May also be secondary to prazosin. Plan: - Continue to monitor, if recurs consider discontinuing prazosin   Chronic Problems:   PTSD: Will continue Parazosin. Needs to follow up with OP psych.    Bipolar Disorder:  Continue home Seroquel 400  mg every day. Needs to follow up OP psych.    Prediabetes Last A1c 5.7, will benefit from PCP follow-up and yearly monitoring. Plan: - PCP appointment made with [facility] for establishing care, hospital follow-up   Rhabdomyolysis Elevated CK admission, with dark/green urine.  Trace blood on repeat dipstick without RBCs.  CK normal today Plan: - Daily BMP   Cocaine use disorder Patient endorses crack cocaine usage today.  Would benefit from cessation  Diet: Normal IVF: NS,Bolus VTE: DOAC Code: Full PT/OT recs: None, none.    Dispo: Anticipated discharge to Home in 1 days pending improvement of AKI.   Lovie Macadamia MD Internal Medicine Resident PGY-1 Pager: [phone] Please contact the on call pager after 5 pm and on weekends at [phone].

## 2023-01-21 NOTE — Discharge Summary (Incomplete)
Name: Cory Henderson MRN: [id number] DOB: 1976-02-12 47 y.o. PCP: Patient, No Pcp Per  Date of Admission: 01/19/2023  7:19 PM Date of Discharge:  01/22/2023 Attending Physician: Dr.  Sol Blazing  DISCHARGE DIAGNOSIS:  Primary Problem: Acute encephalopathy   Hospital Problems: Principal Problem:   Acute encephalopathy    DISCHARGE MEDICATIONS:   Allergies as of 01/22/2023       Reactions   Fish Allergy Anaphylaxis, Hives, Itching   Other Rash, Other (See Comments)   Skin Bleaching-sunscreen   Stearyl Alcohol Rash   Itching        Medication List     STOP taking these medications    oxyCODONE 5 MG immediate release tablet Commonly known as: Roxicodone       TAKE these medications    acetaminophen 325 MG tablet Commonly known as: TYLENOL Take 2 tablets (650 mg total) by mouth every 6 (six) hours as needed for mild pain or moderate pain.   allopurinol 300 MG tablet Commonly known as: ZYLOPRIM Take 300 mg by mouth daily.   amLODipine 10 MG tablet Commonly known as: NORVASC Take 10 mg by mouth daily.   atorvastatin 40 MG tablet Commonly known as: LIPITOR Take 1 tablet (40 mg total) by mouth daily.   hydrOXYzine 50 MG tablet Commonly known as: ATARAX Take 50 mg by mouth at bedtime.   prazosin 2 MG capsule Commonly known as: MINIPRESS Take 1 capsule (2 mg total) by mouth at bedtime.   QUEtiapine 400 MG tablet Commonly known as: SEROQUEL Take 1 tablet (400 mg total) by mouth at bedtime.        DISPOSITION AND FOLLOW-UP:  Cory Henderson was discharged from [facility] in [place] condition. At the hospital follow up visit please address:  Follow-up Recommendations: Consults: Behavioral health, psychiatry Labs: Basic Metabolic Profile Studies: None Medications: Patient has not required amlodipine or hydroxyzine during this admission.  Patient has not been taking allopurinol.  Please resume these medications at PCP  discretion.  Follow-up Appointments: 02/10/2023 8:45 AM [name], MD   [facility] - A Dept Of [place]. [facility] COURSE:  Patient Summary: [name] is a 47 year old gentleman with past medical history of substance use disorder, hypertension, hyperlipidemia, prior seizure, bipolar, and PTSD who presented to the ED with altered mental status.  Per reports the patient was at his sister's house when he went to the bathroom, returned in an altered state.  He was brought to the emergency department and admitted for further workup.  Noncontrast CT was unrevealing, CTA showed continue enhancement within the left MCA M1 segment which was favored to be artifactual.  CT perfusion study showed small area of calculated ischemic penumbra in the left frontal lobe and the left ACA territory.  MRI brain which consisted of only axial DWI showed no acute ischemia, though the study was motion degraded.  EEG monitoring was negative.  The patient also had atypical urine color (green) and elevated CK raising concerns for rhabdomyolysis.  Per neurology the patient's altered mental status was likely secondary to substance abuse versus reversible vasoconstriction syndrome due to cocaine use, versus provoked seizure in the setting of cocaine use.  No recommendations were made for starting antiseizure medication.  The patient's stay was complicated by hypotension, and AKI which both resolved with 1 L of fluid resuscitation.  The patient was discharged in stable condition after receiving counseling on substance use and seizure precautions  per neurology.  He will follow-up with [place] in the [facility] for further evaluation.   Acute encephalopathy with concern for seizures: Per neurology likely due to substance use versus seizure versus reversible vasoconstriction syndrome.  No indication for antiseizure medication at this time.   AKI: Baseline creatinine around 1.0, peaked at 1.4  coming down to 1.2 on discharge.  Would benefit from repeat BMP at follow-up.  PTSD: Will continue Prazosin.  Would benefit from follow up with OP psych.    Bipolar Disorder:  Continue home Seroquel 400 mg every day.  Would benefit from follow up OP psych.    Prediabetes: Last A1c 5.7, will benefit from PCP follow-up and yearly monitoring.   Rhabdomyolysis: Elevated CK admission, with dark/green urine.  Trace blood on repeat dipstick without RBCs.  CK normalized with IV fluid   Cocaine use disorder: Patient endorses crack cocaine usage.  Counseled on cessation. Would benefit from continued discussion regarding cessation   DISCHARGE INSTRUCTIONS:   Discharge Instructions     Call MD for:   Complete by: As directed    Repeat bout of altered mental status, passing out, or forgetfulness.   Call MD for:  persistant dizziness or light-headedness   Complete by: As directed    Diet - low sodium heart healthy   Complete by: As directed    Increase activity slowly   Complete by: As directed        SUBJECTIVE:  Patient feeling well today, back to baseline.  No further altered mental status or confusion.  Blood pressure normal as well as resolution of AKI.  Still slightly above creatinine baseline, will benefit from outpatient follow-up.  Discussed importance of abstaining from substances as well as following up with his outpatient to take care of some of his chronic conditions.   Reviewed post seizure guidelines as laid out by neurology, also included in discharge summary. Patient voiced understanding the plan.   Discharge Vitals:   BP 120/73 (BP Location: Left Arm)   Pulse 72   Temp 98.1 F (36.7 C) (Oral)   Resp 18   Ht 6' (1.829 m)   SpO2 100%   BMI 33.22 kg/m   OBJECTIVE:  Physical Exam Constitutional:      Appearance: Normal appearance.  HENT:     Head: Normocephalic and atraumatic.  Cardiovascular:     Rate and Rhythm: Normal rate and regular rhythm.  Pulmonary:      Effort: Pulmonary effort is normal.     Breath sounds: Normal breath sounds.  Abdominal:     General: Abdomen is flat. Bowel sounds are normal.  Skin:    General: Skin is warm and dry.  Neurological:     Mental Status: He is alert.  Psychiatric:        Mood and Affect: Mood normal.        Behavior: Behavior normal.      Pertinent Labs, Studies, and Procedures:     Latest Ref Rng & Units 01/21/2023    4:30 AM 01/20/2023    6:25 AM 01/19/2023    7:29 PM  CBC  WBC 4.0 - 10.5 K/uL 7.4  8.1  7.7   Hemoglobin 13.0 - 17.0 g/dL 86.5  78.4  69.6   Hematocrit 39.0 - 52.0 % 33.8  37.3  39.5   Platelets 150 - 400 K/uL 268  274  328        Latest Ref Rng & Units 01/22/2023    6:18 AM 01/21/2023  2:03 PM 01/21/2023    4:30 AM  CMP  Glucose 70 - 99 mg/dL 191  478  99   BUN 6 - 20 mg/dL 17  20  19    Creatinine 0.61 - 1.24 mg/dL 2.95  6.21  3.08   Sodium 135 - 145 mmol/L 141  142  140   Potassium 3.5 - 5.1 mmol/L 3.5  3.8  3.6   Chloride 98 - 111 mmol/L 108  105  108   CO2 22 - 32 mmol/L 25  28  26    Calcium 8.9 - 10.3 mg/dL 8.2  8.2  8.1     Overnight EEG with video  Result Date: 01/20/2023 Charlsie Quest, MD     01/21/2023  9:22 AM Patient Name: Maynard David MRN: [id number] Epilepsy Attending: Charlsie Quest Referring Physician/Provider: Erick Blinks, MD Duration: 01/20/2023 0420 to 01/21/2023 0420 Patient history: 47 yo M. Went to bathroom and came back and sat on couch and was not very responsive and somnolent. EMS called and brought in to the ED where with vigorous stimulation, he will make eye contact and oriented to self and follow commands, moving all extremities but unable to provide much history. EEG to evaluate for seizure Level of alertness: Awake, asleep AEDs during EEG study: LEV Technical aspects: This EEG study was done with scalp electrodes positioned according to the 10-20 International system of electrode placement. Electrical activity was reviewed with  band pass filter of 1-70Hz , sensitivity of 7 uV/mm, display speed of 30mm/sec with a 60Hz  notched filter applied as appropriate. EEG data were recorded continuously and digitally stored.  Video monitoring was available and reviewed as appropriate. Description: The posterior dominant rhythm consists of 7.5 Hz activity of moderate voltage (25-35 uV) seen predominantly in posterior head regions, symmetric and reactive to eye opening and eye closing. Sleep was characterized by vertex waves, sleep spindles (12 to 14 Hz), maximal frontocentral region. Hyperventilation and photic stimulation were not performed.   IMPRESSION: This study is within normal limits. No seizures or epileptiform discharges were seen throughout the recording. A normal interictal EEG does not exclude the diagnosis of epilepsy. Charlsie Quest   MR BRAIN WO CONTRAST  Result Date: 01/20/2023 CLINICAL DATA:  New onset seizure EXAM: MRI HEAD WITHOUT CONTRAST TECHNIQUE: Multiplanar, multiecho pulse sequences of the brain and surrounding structures were obtained without intravenous contrast. COMPARISON:  None Available. FINDINGS: Only axial diffusion-weighted imaging was obtained. This is severely motion degraded. No acute ischemia is visible. IMPRESSION: Severely motion degraded examination consisting only of axial diffusion-weighted imaging. No acute ischemia is visible. Electronically Signed   By: Deatra Robinson M.D.   On: 01/20/2023 02:31   DG Abd 1 View  Result Date: 01/20/2023 CLINICAL DATA:  Acute encephalopathy EXAM: ABDOMEN - 1 VIEW COMPARISON:  07/27/2017 FINDINGS: Contrast material seen within the renal collecting systems bilaterally and urinary bladder. No bowel obstruction, organomegaly or free air. IMPRESSION: No acute findings. Electronically Signed   By: Charlett Nose M.D.   On: 01/20/2023 01:23   CT CEREBRAL PERFUSION W CONTRAST  Result Date: 01/19/2023 CLINICAL DATA:  Right arm weakness EXAM: CT PERFUSION BRAIN TECHNIQUE:  Multiphase CT imaging of the brain was performed following IV bolus contrast injection. Subsequent parametric perfusion maps were calculated using RAPID software. RADIATION DOSE REDUCTION: This exam was performed according to the departmental dose-optimization program which includes automated exposure control, adjustment of the mA and/or kV according to patient size and/or use of iterative reconstruction technique. CONTRAST:  40mL OMNIPAQUE IOHEXOL 350 MG/ML SOLN, 75mL OMNIPAQUE IOHEXOL 350 MG/ML SOLN COMPARISON:  None Available. FINDINGS: CT Brain Perfusion Findings: CBF (<30%) Volume: 0mL Perfusion (Tmax>6.0s) volume: 4mL Mismatch Volume: 4mL ASPECTS on noncontrast CT Head: 10 at 9:04 p.m. today. Infarct Core: 0 mL Infarction Location:No core infarct. The small area of calculated ischemic penumbra is located within the left frontal lobe, in the left ACA territory. IMPRESSION: 1. No core infarct. 2. Small area of calculated ischemic penumbra in the left frontal lobe, in the left ACA territory. Electronically Signed   By: Deatra Robinson M.D.   On: 01/19/2023 22:02   CT ANGIO HEAD NECK W WO CM (CODE STROKE)  Result Date: 01/19/2023 CLINICAL DATA:  Left facial droop.  Right-sided weakness. EXAM: CT ANGIOGRAPHY HEAD AND NECK WITH AND WITHOUT CONTRAST TECHNIQUE: Multidetector CT imaging of the head and neck was performed using the standard protocol during bolus administration of intravenous contrast. Multiplanar CT image reconstructions and MIPs were obtained to evaluate the vascular anatomy. Carotid stenosis measurements (when applicable) are obtained utilizing NASCET criteria, using the distal internal carotid diameter as the denominator. RADIATION DOSE REDUCTION: This exam was performed according to the departmental dose-optimization program which includes automated exposure control, adjustment of the mA and/or kV according to patient size and/or use of iterative reconstruction technique. CONTRAST:  75mL OMNIPAQUE  IOHEXOL 350 MG/ML SOLN COMPARISON:  Head CT same day FINDINGS: CTA NECK FINDINGS SKELETON: No acute abnormality or high grade bony spinal canal stenosis. OTHER NECK: Normal pharynx, larynx and major salivary glands. No cervical lymphadenopathy. Unremarkable thyroid gland. UPPER CHEST: No pneumothorax or pleural effusion. No nodules or masses. AORTIC ARCH: There is no calcific atherosclerosis of the aortic arch. Normal variant aortic arch branching pattern with the left vertebral artery arising independently from the aortic arch. RIGHT CAROTID SYSTEM: Normal without aneurysm, dissection or stenosis. LEFT CAROTID SYSTEM: Normal without aneurysm, dissection or stenosis. VERTEBRAL ARTERIES: Right dominant configuration. There is no dissection, occlusion or flow-limiting stenosis to the skull base (V1-V3 segments). CTA HEAD FINDINGS POSTERIOR CIRCULATION: --Vertebral arteries: Normal V4 segments. --Inferior cerebellar arteries: Normal. --Basilar artery: Normal. --Superior cerebellar arteries: Normal. --Posterior cerebral arteries (PCA): Normal proximally. Limited visualization beyond the proximal P2 segments due to streak artifacts caused by the patient's left arm. ANTERIOR CIRCULATION: --Intracranial internal carotid arteries: Normal. --Anterior cerebral arteries (ACA): Normal. --Middle cerebral arteries (MCA): Attenuation of the enhancement within the left MCA M1 segment is favored to be artifactual. Otherwise normal. VENOUS SINUSES: As permitted by contrast timing, patent. ANATOMIC VARIANTS: None Review of the MIP images confirms the above findings. IMPRESSION: 1. No emergent large vessel occlusion or high-grade stenosis of the intracranial arteries. 2. Attenuated enhancement within the left MCA M1 segment is favored to be artifactual. These results were called by telephone at the time of interpretation on 01/19/2023 at 9:54 pm to provider [facility] , who verbally acknowledged these results. Electronically  Signed   By: Deatra Robinson M.D.   On: 01/19/2023 21:54   CT Head Wo Contrast  Result Date: 01/19/2023 CLINICAL DATA:  Memory loss and altered mental status EXAM: CT HEAD WITHOUT CONTRAST TECHNIQUE: Contiguous axial images were obtained from the base of the skull through the vertex without intravenous contrast. RADIATION DOSE REDUCTION: This exam was performed according to the departmental dose-optimization program which includes automated exposure control, adjustment of the mA and/or kV according to patient size and/or use of iterative reconstruction technique. COMPARISON:  10/29/2022 FINDINGS: Brain: No mass,hemorrhage or extra-axial collection. Normal  appearance of the parenchyma and CSF spaces. Vascular: No hyperdense vessel or unexpected vascular calcification. Skull: The visualized skull base, calvarium and extracranial soft tissues are normal. Sinuses/Orbits: No fluid levels or advanced mucosal thickening of the visualized paranasal sinuses. No mastoid or middle ear effusion. Normal orbits. IMPRESSION: Normal head CT. Electronically Signed   By: Deatra Robinson M.D.   On: 01/19/2023 21:17   DG Chest Portable 1 View  Result Date: 01/19/2023 CLINICAL DATA:  Cough EXAM: PORTABLE CHEST 1 VIEW COMPARISON:  10/03/2021 FINDINGS: Low lung volumes with bibasilar atelectasis. No effusions or pneumothorax. Heart and mediastinal contours are within normal limits. No acute bony abnormality. IMPRESSION: Low lung volumes, bibasilar atelectasis. Electronically Signed   By: Charlett Nose M.D.   On: 01/19/2023 20:39     Signed: Lovie Macadamia MD Internal Medicine Resident, PGY-1 [facility]  Pager: [phone] 10:03 AM, 01/22/2023

## 2023-01-21 NOTE — Progress Notes (Signed)
LTM maint complete - no skin breakdown seen.  A several leads reapplied, hair net placed. [facility] monitored, Event button test confirmed by [facility].

## 2023-01-21 NOTE — Hospital Course (Addendum)
[name] is a 47 year old gentleman with past medical history of substance use disorder, hypertension, hyperlipidemia, prior seizure, bipolar, and PTSD who presented to the ED with altered mental status.  Per reports the patient was at his sister's house when he went to the bathroom, returned in an altered state.  He was brought to the emergency department and admitted for further workup.  Noncontrast CT was unrevealing, CTA showed continue enhancement within the left MCA M1 segment which was favored to be artifactual.  CT perfusion study showed small area of calculated ischemic penumbra in the left frontal lobe and the left ACA territory.  MRI brain which consisted of only axial DWI showed no acute ischemia, though the study was motion degraded.  EEG monitoring was negative.  The patient also had atypical urine color (green) and elevated CK raising concerns for rhabdomyolysis.  Per neurology the patient's altered mental status was likely secondary to substance abuse versus reversible vasoconstriction syndrome due to cocaine use, versus provoked seizure in the setting of cocaine use.  No recommendations were made for starting antiseizure medication.  The patient's stay was complicated by hypotension, and AKI which both resolved with 1 L of fluid resuscitation.  The patient was discharged in stable condition after receiving counseling on substance use and seizure precautions per neurology.  He will follow-up with [place] in the [facility] for further evaluation.   Acute encephalopathy with concern for seizures: Per neurology likely due to substance use versus seizure versus reversible vasoconstriction syndrome.  No indication for antiseizure medication at this time.   AKI: Baseline creatinine around 1.0, peaked at 1.4 coming down to 1.2 on discharge.  Would benefit from repeat BMP at follow-up.  PTSD: Will continue Parazosin.  Would benefit from follow up with OP psych.    Bipolar Disorder:  Continue home  Seroquel 400 mg every day.  Would benefit from follow up OP psych.    Prediabetes: Last A1c 5.7, will benefit from PCP follow-up and yearly monitoring.   Rhabdomyolysis: Elevated CK admission, with dark/green urine.  Trace blood on repeat dipstick without RBCs.  CK normalized with IV fluid   Cocaine use disorder: Patient endorses crack cocaine usage today.  Would benefit from continued discussion regarding next cessation

## 2023-01-21 NOTE — Progress Notes (Signed)
LTM EEG discontinued - no skin breakdown at unhook.   

## 2023-01-22 ENCOUNTER — Other Ambulatory Visit (HOSPITAL_COMMUNITY): Payer: Self-pay

## 2023-01-22 ENCOUNTER — Other Ambulatory Visit: Payer: Self-pay

## 2023-01-22 DIAGNOSIS — G9340 Encephalopathy, unspecified: Secondary | ICD-10-CM | POA: Diagnosis not present

## 2023-01-22 LAB — BASIC METABOLIC PANEL
Anion gap: 8 (ref 5–15)
BUN: 17 mg/dL (ref 6–20)
CO2: 25 mmol/L (ref 22–32)
Calcium: 8.2 mg/dL — ABNORMAL LOW (ref 8.9–10.3)
Chloride: 108 mmol/L (ref 98–111)
Creatinine, Ser: 1.2 mg/dL (ref 0.61–1.24)
GFR, Estimated: >60 mL/min (ref >60)
Glucose, Bld: 102 mg/dL — ABNORMAL HIGH (ref 70–99)
Potassium: 3.5 mmol/L (ref 3.5–5.1)
Sodium: 141 mmol/L (ref 135–145)

## 2023-01-22 MED ORDER — QUETIAPINE FUMARATE 400 MG PO TABS
400.0000 mg | ORAL_TABLET | Freq: Every day | ORAL | 0 refills | Status: DC
  Filled 2023-01-22: qty 30, 30d supply, fill #0

## 2023-01-22 MED ORDER — ATORVASTATIN CALCIUM 40 MG PO TABS
40.0000 mg | ORAL_TABLET | Freq: Every day | ORAL | 0 refills | Status: DC
  Filled 2023-01-22: qty 30, 30d supply, fill #0

## 2023-01-22 MED ORDER — PRAZOSIN HCL 2 MG PO CAPS
2.0000 mg | ORAL_CAPSULE | Freq: Every day | ORAL | 0 refills | Status: DC
  Filled 2023-01-22: qty 30, 30d supply, fill #0

## 2023-01-22 NOTE — TOC Transition Note (Signed)
Transition of Care [facility]) - CM/SW Discharge Note   Patient Details  Name: Cory Henderson MRN: [id number] Date of Birth: 02-May-1975  [facility] CM/SW Contact:  Kermit Balo, RN Phone Number: 01/22/2023, 11:33 AM   Clinical Narrative:     Patient is discharging home with self care. No follow up per PT.  Pt without a PCP. [facility] is picking him up in their office. Information on the AVS. Pt uses bus for transportation. He has no transport home. CM has provided the bedside RN a cab voucher for transport. Pt states he is out of his medications. CM updated MD. Medications to be delivered to the room per [facility] pharmacy.  Pt has supervision at home per his girlfriend.  Final next level of care: Home/Self Care Barriers to Discharge: No Barriers Identified   Patient Goals and CMS Choice      Discharge Placement                         Discharge Plan and Services Additional resources added to the After Visit Summary for                                       Social Determinants of Health (SDOH) Interventions SDOH Screenings   Food Insecurity: No Food Insecurity (01/22/2023)  Housing: Low Risk  (01/22/2023)  Transportation Needs: No Transportation Needs (01/22/2023)  Utilities: Not At Risk (01/22/2023)  Depression (PHQ2-9): Medium Risk (02/06/2020)  Social Connections: Unknown (07/19/2021)   [facility] from [facility]  Tobacco Use: High Risk (12/21/2022)     Readmission Risk Interventions    12/26/2022   12:47 PM 12/23/2022   12:23 PM  Readmission Risk Prevention Plan  Post Dischage Appt Complete Complete  Medication Screening Complete Complete  Transportation Screening Complete Complete

## 2023-01-22 NOTE — Plan of Care (Signed)
  Problem: Education: Goal: Knowledge of General Education information will improve Description Including pain rating scale, medication(s)/side effects and non-pharmacologic comfort measures Outcome: Progressing   

## 2023-01-22 NOTE — Plan of Care (Signed)
Discharge instructions discussed with patient.  Patient instructed on home medications, restrictions, and follow up appointments. Belongings gathered and sent with patient.  Patients medications received from [facility]  Patient discharged via wheelchair by this writer to d/c lounge

## 2023-02-10 ENCOUNTER — Encounter: Payer: Managed Care, Other (non HMO) | Admitting: Student

## 2023-02-21 ENCOUNTER — Other Ambulatory Visit (HOSPITAL_COMMUNITY): Payer: Self-pay

## 2023-02-21 ENCOUNTER — Emergency Department (HOSPITAL_COMMUNITY)
Admission: EM | Admit: 2023-02-21 | Discharge: 2023-02-21 | Disposition: A | Discharge status: Home / Self Care | Payer: Commercial Managed Care - HMO | Attending: Emergency Medicine | Admitting: Emergency Medicine

## 2023-02-21 ENCOUNTER — Emergency Department (HOSPITAL_COMMUNITY): Payer: Commercial Managed Care - HMO

## 2023-02-21 ENCOUNTER — Other Ambulatory Visit: Payer: Self-pay

## 2023-02-21 ENCOUNTER — Encounter (HOSPITAL_COMMUNITY): Payer: Self-pay | Admitting: *Deleted

## 2023-02-21 DIAGNOSIS — R202 Paresthesia of skin: Secondary | ICD-10-CM

## 2023-02-21 DIAGNOSIS — M4802 Spinal stenosis, cervical region: Secondary | ICD-10-CM | POA: Insufficient documentation

## 2023-02-21 LAB — COMPREHENSIVE METABOLIC PANEL
ALT: 22 U/L (ref 0–44)
AST: 22 U/L (ref 15–41)
Albumin: 3 g/dL — ABNORMAL LOW (ref 3.5–5.0)
Alkaline Phosphatase: 44 U/L (ref 38–126)
Anion gap: 7 (ref 5–15)
BUN: 19 mg/dL (ref 6–20)
CO2: 25 mmol/L (ref 22–32)
Calcium: 8.2 mg/dL — ABNORMAL LOW (ref 8.9–10.3)
Chloride: 107 mmol/L (ref 98–111)
Creatinine, Ser: 1.21 mg/dL (ref 0.61–1.24)
GFR, Estimated: >60 mL/min (ref >60)
Glucose, Bld: 86 mg/dL (ref 70–99)
Potassium: 4.2 mmol/L (ref 3.5–5.1)
Sodium: 139 mmol/L (ref 135–145)
Total Bilirubin: 0.5 mg/dL (ref <1.2)
Total Protein: 5.3 g/dL — ABNORMAL LOW (ref 6.5–8.1)

## 2023-02-21 LAB — CBC WITH DIFFERENTIAL/PLATELET
Abs Immature Granulocytes: 0.02 10*3/uL (ref 0.00–0.07)
Basophils Absolute: 0 10*3/uL (ref 0.0–0.1)
Basophils Relative: 1 %
Eosinophils Absolute: 0.2 10*3/uL (ref 0.0–0.5)
Eosinophils Relative: 2 %
HCT: 36.4 % — ABNORMAL LOW (ref 39.0–52.0)
Hemoglobin: 11.8 g/dL — ABNORMAL LOW (ref 13.0–17.0)
Immature Granulocytes: 0 %
Lymphocytes Relative: 36 %
Lymphs Abs: 2.3 10*3/uL (ref 0.7–4.0)
MCH: 29.9 pg (ref 26.0–34.0)
MCHC: 32.4 g/dL (ref 30.0–36.0)
MCV: 92.2 fL (ref 80.0–100.0)
Monocytes Absolute: 0.7 10*3/uL (ref 0.1–1.0)
Monocytes Relative: 10 %
Neutro Abs: 3.3 10*3/uL (ref 1.7–7.7)
Neutrophils Relative %: 51 %
Platelets: 292 10*3/uL (ref 150–400)
RBC: 3.95 MIL/uL — ABNORMAL LOW (ref 4.22–5.81)
RDW: 14.4 % (ref 11.5–15.5)
WBC: 6.5 10*3/uL (ref 4.0–10.5)
nRBC: 0 % (ref 0.0–0.2)

## 2023-02-21 LAB — CK: Total CK: 339 U/L (ref 49–397)

## 2023-02-21 MED ORDER — PREDNISONE 50 MG PO TABS
50.0000 mg | ORAL_TABLET | Freq: Every day | ORAL | 0 refills | Status: DC
  Filled 2023-02-21: qty 5, 5d supply, fill #0

## 2023-02-21 MED ORDER — KETOROLAC TROMETHAMINE 30 MG/ML IJ SOLN
30.0000 mg | Freq: Once | INTRAMUSCULAR | Status: AC
  Administered 2023-02-21: 30 mg via INTRAMUSCULAR
  Filled 2023-02-21: qty 1

## 2023-02-21 MED ORDER — PREDNISONE 50 MG PO TABS: 50.0000 mg | ORAL_TABLET | Freq: Every day | ORAL | 0 refills | Status: DC

## 2023-02-21 MED ORDER — PREDNISONE 20 MG PO TABS
60.0000 mg | ORAL_TABLET | Freq: Once | ORAL | Status: AC
  Administered 2023-02-21: 60 mg via ORAL
  Filled 2023-02-21: qty 3

## 2023-02-21 NOTE — ED Provider Notes (Signed)
[place] EMERGENCY DEPARTMENT AT [facility] Provider Note   CSN: [id number] Arrival date & time: 02/21/23  1319     History  Chief Complaint  Patient presents with   Numbness    Cory Henderson is a 47 y.o. male.  Pt is a 47 yo male with pmhx significant for bipolar d/o, htn, anxiety, hld, schizophrenia, and polysubstance abuse.  Pt is here today because of numbness that he has to both hands and pain to right knee.  Both sx have been going on for months.  Pt was here in August for the same and had a MRI of his cervical spine which showed severe cervical spinal stenosis.  He has not followed up. Pt said he has gout in his right knee.  Pt was admitted last month for AMS and was found to be cocaine positive.  He denies current use.         Home Medications Prior to Admission medications   Medication Sig Start Date End Date Taking? Authorizing Provider  predniSONE (DELTASONE) 50 MG tablet Take 1 tablet (50 mg total) by mouth daily with breakfast. 02/21/23  Yes Jacalyn Lefevre, MD  acetaminophen (TYLENOL) 325 MG tablet Take 2 tablets (650 mg total) by mouth every 6 (six) hours as needed for mild pain or moderate pain. Patient not taking: Reported on 01/20/2023 12/26/22   Philomena Doheny, MD  allopurinol (ZYLOPRIM) 300 MG tablet Take 300 mg by mouth daily. Patient not taking: Reported on 01/20/2023 09/17/22   [provider]  amLODipine (NORVASC) 10 MG tablet Take 10 mg by mouth daily. Patient not taking: Reported on 01/20/2023 07/26/21   [provider]  atorvastatin (LIPITOR) 40 MG tablet Take 1 tablet (40 mg total) by mouth daily. 01/22/23   Lovie Macadamia, MD  hydrOXYzine (ATARAX) 50 MG tablet Take 50 mg by mouth at bedtime. Patient not taking: Reported on 01/20/2023 09/16/21   [provider]  prazosin (MINIPRESS) 2 MG capsule Take 1 capsule (2 mg total) by mouth at bedtime. 01/22/23   Lovie Macadamia, MD  QUEtiapine (SEROQUEL)  400 MG tablet Take 1 tablet (400 mg total) by mouth at bedtime. 01/22/23   Lovie Macadamia, MD      Allergies    Fish allergy, Other, and Stearyl alcohol    Review of Systems   Review of Systems  Musculoskeletal:        Right knee pain  Neurological:  Positive for numbness.    Physical Exam Updated Vital Signs BP 126/81 (BP Location: Right Arm)   Pulse 83   Temp 98.2 F (36.8 C)   Resp 19   Ht 6' (1.829 m)   Wt 98.9 kg   SpO2 99%   BMI 29.57 kg/m  Physical Exam Vitals and nursing note reviewed.  Constitutional:      Appearance: Normal appearance.  HENT:     Head: Normocephalic and atraumatic.     Right Ear: External ear normal.     Left Ear: External ear normal.     Nose: Nose normal.     Mouth/Throat:     Mouth: Mucous membranes are moist.     Pharynx: Oropharynx is clear.  Eyes:     Extraocular Movements: Extraocular movements intact.     Conjunctiva/sclera: Conjunctivae normal.     Pupils: Pupils are equal, round, and reactive to light.  Cardiovascular:     Rate and Rhythm: Normal rate and regular rhythm.     Pulses: Normal pulses.  Heart sounds: Normal heart sounds.  Pulmonary:     Effort: Pulmonary effort is normal.     Breath sounds: Normal breath sounds.  Abdominal:     General: Abdomen is flat. Bowel sounds are normal.     Palpations: Abdomen is soft.  Musculoskeletal:        General: Normal range of motion.     Cervical back: Normal range of motion and neck supple.     Comments: Right knee with good rom.  No redness/swelling  Skin:    General: Skin is warm.     Capillary Refill: Capillary refill takes less than 2 seconds.  Neurological:     General: No focal deficit present.     Mental Status: He is alert and oriented to person, place, and time.     Comments: Decreased sensation to both hands  Psychiatric:        Mood and Affect: Mood normal.        Behavior: Behavior normal.     ED Results / Procedures / Treatments   Labs (all labs  ordered are listed, but only abnormal results are displayed) Labs Reviewed  COMPREHENSIVE METABOLIC PANEL - Abnormal; Notable for the following components:      Result Value   Calcium 8.2 (*)    Total Protein 5.3 (*)    Albumin 3.0 (*)    All other components within normal limits  CBC WITH DIFFERENTIAL/PLATELET - Abnormal; Notable for the following components:   RBC 3.95 (*)    Hemoglobin 11.8 (*)    HCT 36.4 (*)    All other components within normal limits  CK  URINALYSIS, ROUTINE W REFLEX MICROSCOPIC  RAPID URINE DRUG SCREEN, HOSP PERFORMED    EKG None  Radiology DG Cervical Spine Complete  Result Date: 02/21/2023 CLINICAL DATA:  Neck pain. EXAM: CERVICAL SPINE - COMPLETE 4+ VIEW COMPARISON:  MR cervical spine dated 10/29/2022. FINDINGS: There is no evidence of cervical spine fracture or prevertebral soft tissue swelling. Alignment is normal. There is multilevel degenerative disc and joint disease, most significant at C5-6. There is moderate to severe neuroforaminal stenosis at multiple levels between C2-3 and C5-6. IMPRESSION: Multilevel degenerative changes of the cervical spine with moderate to severe neuroforaminal stenosis at multiple levels. Electronically Signed   By: Romona Curls M.D.   On: 02/21/2023 15:15   DG Knee Complete 4 Views Right  Result Date: 02/21/2023 CLINICAL DATA:  Knee pain. EXAM: RIGHT KNEE - COMPLETE 4+ VIEW COMPARISON:  Knee radiographs dated 10/29/2022. FINDINGS: No evidence of fracture or dislocation. There is a small knee joint effusion. Chronic calcific changes of the patellar tendon are redemonstrated. There is minimal osteoarthritis of the patellofemoral compartment. Soft tissues are unremarkable. IMPRESSION: Small knee joint effusion.  Minimal osteoarthritis. Electronically Signed   By: Romona Curls M.D.   On: 02/21/2023 15:13    Procedures Procedures    Medications Ordered in ED Medications  predniSONE (DELTASONE) tablet 60 mg (has no  administration in time range)  ketorolac (TORADOL) 30 MG/ML injection 30 mg (30 mg Intramuscular Given 02/21/23 1533)    ED Course/ Medical Decision Making/ A&P                                 Medical Decision Making Amount and/or Complexity of Data Reviewed Labs: ordered. Radiology: ordered.  Risk Prescription drug management.   This patient presents to the ED for concern of arm  numbness, this involves an extensive number of treatment options, and is a complaint that carries with it a high risk of complications and morbidity.  The differential diagnosis includes spinal stenosis, neuropathy   Co morbidities that complicate the patient evaluation  bipolar d/o, htn, anxiety, hld, schizophrenia, and polysubstance abuse   Additional history obtained:  Additional history obtained from epic chart review External records from outside source obtained and reviewed including family   Lab Tests:  I Ordered, and personally interpreted labs.  The pertinent results include:  cbc with hgb 11.8 (stable), cmp nl, ck nl   Imaging Studies ordered:  I ordered imaging studies including c-spine and knee  I independently visualized and interpreted imaging which showed  C-spine: Multilevel degenerative changes of the cervical spine with moderate  to severe neuroforaminal stenosis at multiple levels.  R knee: Small knee joint effusion.  Minimal osteoarthritis.  I agree with the radiologist interpretation  Medicines ordered and prescription drug management:  I ordered medication including toradol/prednisone  for sx  Reevaluation of the patient after these medicines showed that the patient improved I have reviewed the patients home medicines and have made adjustments as needed  Problem List / ED Course:  Cervical stenosis with paresthesias:  pt told to f/u with ns.  He is d/c with prednisone.  Return if worse.    Reevaluation:  After the interventions noted above, I reevaluated the  patient and found that they have :improved   Social Determinants of Health:  Lives at home   Dispostion:  After consideration of the diagnostic results and the patients response to treatment, I feel that the patent would benefit from discharge with outpatient f/u.          Final Clinical Impression(s) / ED Diagnoses Final diagnoses:  Cervical spinal stenosis  Paresthesia    Rx / DC Orders ED Discharge Orders          Ordered    predniSONE (DELTASONE) 50 MG tablet  Daily with breakfast        02/21/23 1557              Jacalyn Lefevre, MD 02/21/23 1600
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
## 2023-02-24 ENCOUNTER — Other Ambulatory Visit (HOSPITAL_COMMUNITY): Payer: Self-pay

## 2023-03-27 ENCOUNTER — Other Ambulatory Visit: Payer: Self-pay

## 2023-03-27 ENCOUNTER — Emergency Department (HOSPITAL_COMMUNITY)
Admission: EM | Admit: 2023-03-27 | Discharge: 2023-03-27 | Disposition: A | Discharge status: Home / Self Care | Payer: Commercial Managed Care - HMO | Attending: Emergency Medicine | Admitting: Emergency Medicine

## 2023-03-27 ENCOUNTER — Encounter (HOSPITAL_COMMUNITY): Payer: Self-pay | Admitting: *Deleted

## 2023-03-27 DIAGNOSIS — I10 Essential (primary) hypertension: Secondary | ICD-10-CM | POA: Insufficient documentation

## 2023-03-27 DIAGNOSIS — Z79899 Other long term (current) drug therapy: Secondary | ICD-10-CM | POA: Insufficient documentation

## 2023-03-27 DIAGNOSIS — R200 Anesthesia of skin: Secondary | ICD-10-CM | POA: Diagnosis present

## 2023-03-27 DIAGNOSIS — R202 Paresthesia of skin: Secondary | ICD-10-CM | POA: Insufficient documentation

## 2023-03-27 LAB — URINALYSIS, ROUTINE W REFLEX MICROSCOPIC
Bacteria, UA: NONE SEEN
Bilirubin Urine: NEGATIVE
Glucose, UA: NEGATIVE mg/dL
Hgb urine dipstick: NEGATIVE
Ketones, ur: 5 mg/dL — AB
Leukocytes,Ua: NEGATIVE
Nitrite: NEGATIVE
Protein, ur: 30 mg/dL — AB
Specific Gravity, Urine: 1.031 — ABNORMAL HIGH (ref 1.005–1.030)
pH: 5 (ref 5.0–8.0)

## 2023-03-27 LAB — COMPREHENSIVE METABOLIC PANEL
ALT: 27 U/L (ref 0–44)
AST: 39 U/L (ref 15–41)
Albumin: 3.4 g/dL — ABNORMAL LOW (ref 3.5–5.0)
Alkaline Phosphatase: 46 U/L (ref 38–126)
Anion gap: 8 (ref 5–15)
BUN: 20 mg/dL (ref 6–20)
CO2: 26 mmol/L (ref 22–32)
Calcium: 8.7 mg/dL — ABNORMAL LOW (ref 8.9–10.3)
Chloride: 105 mmol/L (ref 98–111)
Creatinine, Ser: 1.28 mg/dL — ABNORMAL HIGH (ref 0.61–1.24)
GFR, Estimated: >60 mL/min (ref >60)
Glucose, Bld: 123 mg/dL — ABNORMAL HIGH (ref 70–99)
Potassium: 3.9 mmol/L (ref 3.5–5.1)
Sodium: 139 mmol/L (ref 135–145)
Total Bilirubin: 0.6 mg/dL (ref 0.0–1.2)
Total Protein: 6 g/dL — ABNORMAL LOW (ref 6.5–8.1)

## 2023-03-27 LAB — CBC
HCT: 40.2 % (ref 39.0–52.0)
Hemoglobin: 13.4 g/dL (ref 13.0–17.0)
MCH: 30.6 pg (ref 26.0–34.0)
MCHC: 33.3 g/dL (ref 30.0–36.0)
MCV: 91.8 fL (ref 80.0–100.0)
Platelets: 347 10*3/uL (ref 150–400)
RBC: 4.38 MIL/uL (ref 4.22–5.81)
RDW: 14.6 % (ref 11.5–15.5)
WBC: 9.9 10*3/uL (ref 4.0–10.5)
nRBC: 0 % (ref 0.0–0.2)

## 2023-03-27 MED ORDER — GABAPENTIN 300 MG PO CAPS
300.0000 mg | ORAL_CAPSULE | Freq: Once | ORAL | Status: AC
  Administered 2023-03-27: 300 mg via ORAL
  Filled 2023-03-27: qty 1

## 2023-03-27 MED ORDER — GABAPENTIN 100 MG PO CAPS
100.0000 mg | ORAL_CAPSULE | Freq: Three times a day (TID) | ORAL | 0 refills | Status: DC
Start: 2023-03-27 — End: 2023-05-02

## 2023-03-27 NOTE — Discharge Instructions (Addendum)
 Take the medications as prescribed to see if that helps with the discomfort in your hands and feet.  Follow-up with a primary care doctor and consider seeing a spine doctor for further evaluation
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
## 2023-03-27 NOTE — ED Notes (Signed)
 Assumed care of pt, found him in hall bed alert and oriented.  He reports continued pain from the neck and back that run down his legs and arms causing cramping and sharp/shooting pains.  No new injuries or falls.  He has not followed up w/ the specialist.

## 2023-03-27 NOTE — ED Triage Notes (Signed)
 The pt is c/o pain in both his hands and numbness and both feet and legs and all over his body  for months

## 2023-05-02 ENCOUNTER — Other Ambulatory Visit: Payer: Self-pay

## 2023-05-02 ENCOUNTER — Emergency Department (HOSPITAL_COMMUNITY)
Admission: EM | Admit: 2023-05-02 | Discharge: 2023-05-03 | Disposition: A | Discharge status: Home / Self Care | Payer: Commercial Managed Care - HMO | Attending: Emergency Medicine | Admitting: Emergency Medicine

## 2023-05-02 ENCOUNTER — Encounter (HOSPITAL_COMMUNITY): Payer: Self-pay | Admitting: Emergency Medicine

## 2023-05-02 DIAGNOSIS — Z5900 Homelessness unspecified: Secondary | ICD-10-CM | POA: Diagnosis not present

## 2023-05-02 DIAGNOSIS — F17210 Nicotine dependence, cigarettes, uncomplicated: Secondary | ICD-10-CM | POA: Diagnosis not present

## 2023-05-02 DIAGNOSIS — F209 Schizophrenia, unspecified: Secondary | ICD-10-CM | POA: Diagnosis not present

## 2023-05-02 DIAGNOSIS — R202 Paresthesia of skin: Secondary | ICD-10-CM | POA: Insufficient documentation

## 2023-05-02 DIAGNOSIS — F319 Bipolar disorder, unspecified: Secondary | ICD-10-CM | POA: Diagnosis present

## 2023-05-02 DIAGNOSIS — F315 Bipolar disorder, current episode depressed, severe, with psychotic features: Secondary | ICD-10-CM | POA: Diagnosis not present

## 2023-05-02 MED ORDER — PREDNISONE 20 MG PO TABS: ORAL_TABLET | ORAL | 0 refills | Status: DC

## 2023-05-02 MED ORDER — AMLODIPINE BESYLATE 10 MG PO TABS: 10.0000 mg | ORAL_TABLET | Freq: Every day | ORAL | 1 refills | Status: DC

## 2023-05-02 MED ORDER — HYDROXYZINE HCL 50 MG PO TABS
50.0000 mg | ORAL_TABLET | Freq: Every day | ORAL | 0 refills | Status: DC
Start: 2023-05-02 — End: 2023-05-07

## 2023-05-02 MED ORDER — METHYLPREDNISOLONE SODIUM SUCC 125 MG IJ SOLR
125.0000 mg | Freq: Once | INTRAMUSCULAR | Status: AC
Start: 2023-05-02 — End: 2023-05-02
  Administered 2023-05-02: 125 mg via INTRAMUSCULAR
  Filled 2023-05-02: qty 2

## 2023-05-02 MED ORDER — OXYCODONE-ACETAMINOPHEN 5-325 MG PO TABS
1.0000 | ORAL_TABLET | Freq: Once | ORAL | Status: AC
  Administered 2023-05-02: 1 via ORAL
  Filled 2023-05-02: qty 1

## 2023-05-02 MED ORDER — GABAPENTIN 300 MG PO CAPS: 300.0000 mg | ORAL_CAPSULE | Freq: Three times a day (TID) | ORAL | 1 refills | Status: DC

## 2023-05-02 MED ORDER — ACETAMINOPHEN 325 MG PO TABS: 650.0000 mg | ORAL_TABLET | Freq: Four times a day (QID) | ORAL | Status: DC | PRN

## 2023-05-02 MED ORDER — GABAPENTIN 300 MG PO CAPS
300.0000 mg | ORAL_CAPSULE | Freq: Once | ORAL | Status: AC
  Administered 2023-05-02: 300 mg via ORAL
  Filled 2023-05-02: qty 1

## 2023-05-02 NOTE — ED Triage Notes (Signed)
Pt states he is been having increase pain on his hand and feet, he was seen for same here, but he is not getting any better. Denies any fall or injury.

## 2023-05-02 NOTE — ED Provider Notes (Signed)
EMERGENCY DEPARTMENT AT [facility] Provider Note   CSN: [id number] Arrival date & time: 05/02/23  2206     History  Chief Complaint  Patient presents with   Hand Pain    Cory Henderson is a 48 y.o. male.  48 year old male who presents ER today with persistent paresthesias in bilateral hands.  Also with a little bit in his feet.  Patient states that this has been chronic and progressively worsening.  Has been seen multiple times in the ER for cervical stenosis.   Hand Pain       Home Medications Prior to Admission medications   Medication Sig Start Date End Date Taking? Authorizing Provider  gabapentin (NEURONTIN) 300 MG capsule Take 1 capsule (300 mg total) by mouth 3 (three) times daily. 05/02/23  Yes Clorissa Gruenberg, Barbara Cower, MD  acetaminophen (TYLENOL) 325 MG tablet Take 2 tablets (650 mg total) by mouth every 6 (six) hours as needed for mild pain (pain score 1-3) or moderate pain (pain score 4-6). 05/02/23   Seaborn Nakama, Barbara Cower, MD  amLODipine (NORVASC) 10 MG tablet Take 1 tablet (10 mg total) by mouth daily. 05/02/23   Jamelah Sitzer, Barbara Cower, MD  atorvastatin (LIPITOR) 40 MG tablet Take 1 tablet (40 mg total) by mouth daily. 01/22/23   Lovie Macadamia, MD  hydrOXYzine (ATARAX) 50 MG tablet Take 1 tablet (50 mg total) by mouth at bedtime. 05/02/23   Karan Inclan, Barbara Cower, MD  prazosin (MINIPRESS) 2 MG capsule Take 1 capsule (2 mg total) by mouth at bedtime. 01/22/23   Lovie Macadamia, MD  predniSONE (DELTASONE) 20 MG tablet 3 tabs po daily x 3 days, then 2 tabs x 3 days, then 1.5 tabs x 3 days, then 1 tab x 3 days, then 0.5 tabs x 3 days 05/02/23   Estrellita Lasky, Barbara Cower, MD  QUEtiapine (SEROQUEL) 400 MG tablet Take 1 tablet (400 mg total) by mouth at bedtime. 01/22/23   Lovie Macadamia, MD      Allergies    Fish allergy, Other, and Stearyl alcohol    Review of Systems   Review of Systems  Physical Exam Updated Vital Signs BP (!) 137/95 (BP Location: Right Arm)   Pulse 78    Temp 98.3 F (36.8 C)   Resp 18   Ht 6' (1.829 m)   Wt 99 kg   SpO2 100%   BMI 29.60 kg/m  Physical Exam Vitals and nursing note reviewed.  Constitutional:      Appearance: He is well-developed.  HENT:     Head: Normocephalic and atraumatic.  Cardiovascular:     Rate and Rhythm: Normal rate.  Pulmonary:     Effort: Pulmonary effort is normal. No respiratory distress.  Abdominal:     General: There is no distension.  Musculoskeletal:        General: Normal range of motion.     Cervical back: Normal range of motion.     Comments: Some cramping/contractures in hands bilaterally, good strength. Ambulates without difficulty.   Skin:    General: Skin is warm and dry.  Neurological:     General: No focal deficit present.     Mental Status: He is alert.     ED Results / Procedures / Treatments   Labs (all labs ordered are listed, but only abnormal results are displayed) Labs Reviewed - No data to display  EKG None  Radiology No results found.  Procedures Procedures    Medications Ordered in ED Medications  gabapentin (NEURONTIN) capsule 300 mg (  300 mg Oral Given 05/02/23 2351)  methylPREDNISolone sodium succinate (SOLU-MEDROL) 125 mg/2 mL injection 125 mg (125 mg Intramuscular Given 05/02/23 2351)  oxyCODONE-acetaminophen (PERCOCET/ROXICET) 5-325 MG per tablet 1 tablet (1 tablet Oral Given 05/02/23 2351)    ED Course/ Medical Decision Making/ A&P                                 Medical Decision Making Risk OTC drugs. Prescription drug management.   Persistent paresthesias. Not seen NSG. Sister is his point of contact and ok for her to help make his appointment. TOC consulted for assistance. New Rx given.   Final Clinical Impression(s) / ED Diagnoses Final diagnoses:  Paresthesias    Rx / DC Orders ED Discharge Orders          Ordered    predniSONE (DELTASONE) 20 MG tablet  Status:  Discontinued        05/02/23 2316    gabapentin (NEURONTIN) 300 MG  capsule  3 times daily        05/02/23 2316    Ambulatory referral to Neurosurgery        05/02/23 2316    acetaminophen (TYLENOL) 325 MG tablet  Every 6 hours PRN        05/02/23 2316    amLODipine (NORVASC) 10 MG tablet  Daily        05/02/23 2316    hydrOXYzine (ATARAX) 50 MG tablet  Daily at bedtime        05/02/23 2316    predniSONE (DELTASONE) 20 MG tablet        05/02/23 2317              Lamiyah Schlotter, Barbara Cower, MD 05/03/23 0006

## 2023-05-03 ENCOUNTER — Other Ambulatory Visit: Payer: Self-pay

## 2023-05-03 ENCOUNTER — Encounter: Payer: Self-pay | Admitting: Psychiatry

## 2023-05-03 ENCOUNTER — Ambulatory Visit (INDEPENDENT_AMBULATORY_CARE_PROVIDER_SITE_OTHER)
Admission: EM | Admit: 2023-05-03 | Discharge: 2023-05-03 | Disposition: A | Discharge status: Other Acute Inpatient Hospital | Payer: Commercial Managed Care - HMO | Source: Home / Self Care

## 2023-05-03 ENCOUNTER — Inpatient Hospital Stay: Admit: 2023-05-03 | Payer: Commercial Managed Care - HMO | Source: Ambulatory Visit | Admitting: Psychiatry

## 2023-05-03 ENCOUNTER — Inpatient Hospital Stay
Admit: 2023-05-03 | Discharge: 2023-05-07 | DRG: 885 | Disposition: A | Discharge status: Home / Self Care | Payer: Commercial Managed Care - HMO | Attending: Psychiatry | Admitting: Psychiatry

## 2023-05-03 DIAGNOSIS — F17210 Nicotine dependence, cigarettes, uncomplicated: Secondary | ICD-10-CM | POA: Insufficient documentation

## 2023-05-03 DIAGNOSIS — Z5900 Homelessness unspecified: Secondary | ICD-10-CM | POA: Insufficient documentation

## 2023-05-03 DIAGNOSIS — Z560 Unemployment, unspecified: Secondary | ICD-10-CM

## 2023-05-03 DIAGNOSIS — Z5982 Transportation insecurity: Secondary | ICD-10-CM

## 2023-05-03 DIAGNOSIS — G4700 Insomnia, unspecified: Secondary | ICD-10-CM | POA: Diagnosis present

## 2023-05-03 DIAGNOSIS — Z91148 Patient's other noncompliance with medication regimen for other reason: Secondary | ICD-10-CM

## 2023-05-03 DIAGNOSIS — F319 Bipolar disorder, unspecified: Principal | ICD-10-CM | POA: Diagnosis present

## 2023-05-03 DIAGNOSIS — M4802 Spinal stenosis, cervical region: Secondary | ICD-10-CM | POA: Diagnosis present

## 2023-05-03 DIAGNOSIS — F315 Bipolar disorder, current episode depressed, severe, with psychotic features: Secondary | ICD-10-CM | POA: Insufficient documentation

## 2023-05-03 DIAGNOSIS — F1410 Cocaine abuse, uncomplicated: Secondary | ICD-10-CM | POA: Diagnosis present

## 2023-05-03 DIAGNOSIS — F4310 Post-traumatic stress disorder, unspecified: Secondary | ICD-10-CM | POA: Diagnosis present

## 2023-05-03 DIAGNOSIS — R45851 Suicidal ideations: Secondary | ICD-10-CM | POA: Diagnosis present

## 2023-05-03 DIAGNOSIS — I10 Essential (primary) hypertension: Secondary | ICD-10-CM | POA: Diagnosis present

## 2023-05-03 DIAGNOSIS — Z5941 Food insecurity: Secondary | ICD-10-CM | POA: Diagnosis not present

## 2023-05-03 DIAGNOSIS — Z5902 Unsheltered homelessness: Secondary | ICD-10-CM | POA: Diagnosis not present

## 2023-05-03 DIAGNOSIS — Z91013 Allergy to seafood: Secondary | ICD-10-CM

## 2023-05-03 DIAGNOSIS — Z79899 Other long term (current) drug therapy: Secondary | ICD-10-CM

## 2023-05-03 DIAGNOSIS — F209 Schizophrenia, unspecified: Principal | ICD-10-CM | POA: Diagnosis present

## 2023-05-03 DIAGNOSIS — E785 Hyperlipidemia, unspecified: Secondary | ICD-10-CM | POA: Diagnosis present

## 2023-05-03 DIAGNOSIS — F419 Anxiety disorder, unspecified: Secondary | ICD-10-CM | POA: Diagnosis present

## 2023-05-03 DIAGNOSIS — Z91048 Other nonmedicinal substance allergy status: Secondary | ICD-10-CM | POA: Diagnosis not present

## 2023-05-03 DIAGNOSIS — R202 Paresthesia of skin: Secondary | ICD-10-CM | POA: Diagnosis not present

## 2023-05-03 DIAGNOSIS — F1290 Cannabis use, unspecified, uncomplicated: Secondary | ICD-10-CM | POA: Insufficient documentation

## 2023-05-03 DIAGNOSIS — F3171 Bipolar disorder, in partial remission, most recent episode hypomanic: Secondary | ICD-10-CM | POA: Diagnosis not present

## 2023-05-03 LAB — POCT URINE DRUG SCREEN - MANUAL ENTRY (I-SCREEN)
POC Amphetamine UR: NOT DETECTED
POC Buprenorphine (BUP): NOT DETECTED
POC Cocaine UR: POSITIVE — AB
POC Marijuana UR: POSITIVE — AB
POC Methadone UR: NOT DETECTED
POC Methamphetamine UR: NOT DETECTED
POC Morphine: NOT DETECTED
POC Oxazepam (BZO): NOT DETECTED
POC Oxycodone UR: POSITIVE — AB
POC Secobarbital (BAR): NOT DETECTED

## 2023-05-03 LAB — LIPID PANEL
Cholesterol: 174 mg/dL (ref 0–200)
HDL: 66 mg/dL (ref >40)
LDL Cholesterol: 88 mg/dL (ref 0–99)
Total CHOL/HDL Ratio: 2.6 {ratio}
Triglycerides: 99 mg/dL (ref <150)
VLDL: 20 mg/dL (ref 0–40)

## 2023-05-03 LAB — TSH: TSH: 0.543 u[IU]/mL (ref 0.350–4.500)

## 2023-05-03 LAB — CBC WITH DIFFERENTIAL/PLATELET
Abs Immature Granulocytes: 0.02 10*3/uL (ref 0.00–0.07)
Basophils Absolute: 0 10*3/uL (ref 0.0–0.1)
Basophils Relative: 0 %
Eosinophils Absolute: 0 10*3/uL (ref 0.0–0.5)
Eosinophils Relative: 0 %
HCT: 41.6 % (ref 39.0–52.0)
Hemoglobin: 14 g/dL (ref 13.0–17.0)
Immature Granulocytes: 0 %
Lymphocytes Relative: 7 %
Lymphs Abs: 0.5 10*3/uL — ABNORMAL LOW (ref 0.7–4.0)
MCH: 30.6 pg (ref 26.0–34.0)
MCHC: 33.7 g/dL (ref 30.0–36.0)
MCV: 90.8 fL (ref 80.0–100.0)
Monocytes Absolute: 0.1 10*3/uL (ref 0.1–1.0)
Monocytes Relative: 1 %
Neutro Abs: 6.2 10*3/uL (ref 1.7–7.7)
Neutrophils Relative %: 92 %
Platelets: 309 10*3/uL (ref 150–400)
RBC: 4.58 MIL/uL (ref 4.22–5.81)
RDW: 13.3 % (ref 11.5–15.5)
WBC: 6.8 10*3/uL (ref 4.0–10.5)
nRBC: 0 % (ref 0.0–0.2)

## 2023-05-03 LAB — COMPREHENSIVE METABOLIC PANEL
ALT: 47 U/L — ABNORMAL HIGH (ref 0–44)
AST: 47 U/L — ABNORMAL HIGH (ref 15–41)
Albumin: 3.6 g/dL (ref 3.5–5.0)
Alkaline Phosphatase: 79 U/L (ref 38–126)
Anion gap: 14 (ref 5–15)
BUN: 11 mg/dL (ref 6–20)
CO2: 26 mmol/L (ref 22–32)
Calcium: 9.2 mg/dL (ref 8.9–10.3)
Chloride: 101 mmol/L (ref 98–111)
Creatinine, Ser: 0.99 mg/dL (ref 0.61–1.24)
GFR, Estimated: >60 mL/min (ref >60)
Glucose, Bld: 132 mg/dL — ABNORMAL HIGH (ref 70–99)
Potassium: 4.3 mmol/L (ref 3.5–5.1)
Sodium: 141 mmol/L (ref 135–145)
Total Bilirubin: 0.5 mg/dL (ref 0.0–1.2)
Total Protein: 6.6 g/dL (ref 6.5–8.1)

## 2023-05-03 LAB — MAGNESIUM: Magnesium: 2.1 mg/dL (ref 1.7–2.4)

## 2023-05-03 LAB — HEMOGLOBIN A1C
Hgb A1c MFr Bld: 5.1 % (ref 4.8–5.6)
Mean Plasma Glucose: 99.67 mg/dL

## 2023-05-03 LAB — ETHANOL: Alcohol, Ethyl (B): <10 mg/dL (ref <10)

## 2023-05-03 MED ORDER — MAGNESIUM HYDROXIDE 400 MG/5ML PO SUSP: 30.0000 mL | Freq: Every day | ORAL | Status: DC | PRN

## 2023-05-03 MED ORDER — TRAZODONE HCL 50 MG PO TABS
50.0000 mg | ORAL_TABLET | Freq: Every evening | ORAL | Status: DC | PRN
  Administered 2023-05-04 – 2023-05-06 (×3): 50 mg via ORAL
  Filled 2023-05-03 (×4): qty 1

## 2023-05-03 MED ORDER — HALOPERIDOL LACTATE 5 MG/ML IJ SOLN: 10.0000 mg | Freq: Three times a day (TID) | INTRAMUSCULAR | Status: DC | PRN

## 2023-05-03 MED ORDER — GABAPENTIN 300 MG PO CAPS
300.0000 mg | ORAL_CAPSULE | Freq: Three times a day (TID) | ORAL | Status: DC
  Administered 2023-05-04 – 2023-05-07 (×11): 300 mg via ORAL
  Filled 2023-05-03 (×11): qty 1

## 2023-05-03 MED ORDER — LORAZEPAM 2 MG/ML IJ SOLN: 2.0000 mg | Freq: Three times a day (TID) | INTRAMUSCULAR | Status: DC | PRN

## 2023-05-03 MED ORDER — QUETIAPINE FUMARATE 200 MG PO TABS
400.0000 mg | ORAL_TABLET | Freq: Every day | ORAL | Status: DC
  Administered 2023-05-03 – 2023-05-06 (×4): 400 mg via ORAL
  Filled 2023-05-03 (×5): qty 2

## 2023-05-03 MED ORDER — QUETIAPINE FUMARATE 400 MG PO TABS: 400.0000 mg | ORAL_TABLET | Freq: Every day | ORAL | Status: DC

## 2023-05-03 MED ORDER — ACETAMINOPHEN 325 MG PO TABS
650.0000 mg | ORAL_TABLET | Freq: Four times a day (QID) | ORAL | Status: DC | PRN
  Administered 2023-05-03 – 2023-05-06 (×4): 650 mg via ORAL
  Filled 2023-05-03 (×4): qty 2

## 2023-05-03 MED ORDER — HYDROXYZINE HCL 25 MG PO TABS
25.0000 mg | ORAL_TABLET | Freq: Three times a day (TID) | ORAL | Status: DC | PRN
Start: 2023-05-03 — End: 2023-05-07
  Administered 2023-05-03 – 2023-05-05 (×3): 25 mg via ORAL
  Filled 2023-05-03 (×4): qty 1

## 2023-05-03 MED ORDER — TRAZODONE HCL 50 MG PO TABS: 50.0000 mg | ORAL_TABLET | Freq: Every evening | ORAL | Status: DC | PRN

## 2023-05-03 MED ORDER — PRAZOSIN HCL 2 MG PO CAPS
2.0000 mg | ORAL_CAPSULE | Freq: Every day | ORAL | Status: DC
  Administered 2023-05-03 – 2023-05-06 (×4): 2 mg via ORAL
  Filled 2023-05-03 (×4): qty 1

## 2023-05-03 MED ORDER — DIPHENHYDRAMINE HCL 25 MG PO CAPS: 50.0000 mg | ORAL_CAPSULE | Freq: Three times a day (TID) | ORAL | Status: DC | PRN

## 2023-05-03 MED ORDER — ACETAMINOPHEN 325 MG PO TABS
650.0000 mg | ORAL_TABLET | Freq: Four times a day (QID) | ORAL | Status: DC | PRN
  Administered 2023-05-03: 650 mg via ORAL
  Filled 2023-05-03: qty 2

## 2023-05-03 MED ORDER — DIPHENHYDRAMINE HCL 50 MG/ML IJ SOLN: 50.0000 mg | Freq: Three times a day (TID) | INTRAMUSCULAR | Status: DC | PRN

## 2023-05-03 MED ORDER — AMLODIPINE BESYLATE 10 MG PO TABS
10.0000 mg | ORAL_TABLET | Freq: Every day | ORAL | Status: DC
  Administered 2023-05-03: 10 mg via ORAL
  Filled 2023-05-03: qty 1

## 2023-05-03 MED ORDER — NICOTINE 14 MG/24HR TD PT24
14.0000 mg | MEDICATED_PATCH | Freq: Every day | TRANSDERMAL | Status: DC
  Administered 2023-05-04 – 2023-05-07 (×4): 14 mg via TRANSDERMAL
  Filled 2023-05-03 (×4): qty 1

## 2023-05-03 MED ORDER — GABAPENTIN 300 MG PO CAPS: 300.0000 mg | ORAL_CAPSULE | Freq: Three times a day (TID) | ORAL | Status: DC

## 2023-05-03 MED ORDER — HALOPERIDOL 5 MG PO TABS: 5.0000 mg | ORAL_TABLET | Freq: Three times a day (TID) | ORAL | Status: DC | PRN

## 2023-05-03 MED ORDER — HALOPERIDOL LACTATE 5 MG/ML IJ SOLN: 5.0000 mg | Freq: Three times a day (TID) | INTRAMUSCULAR | Status: DC | PRN

## 2023-05-03 MED ORDER — ATORVASTATIN CALCIUM 40 MG PO TABS
40.0000 mg | ORAL_TABLET | Freq: Every day | ORAL | Status: DC
  Administered 2023-05-03: 40 mg via ORAL
  Filled 2023-05-03: qty 1

## 2023-05-03 MED ORDER — PRAZOSIN HCL 2 MG PO CAPS: 2.0000 mg | ORAL_CAPSULE | Freq: Every day | ORAL | Status: DC

## 2023-05-03 MED ORDER — HYDROXYZINE HCL 25 MG PO TABS
25.0000 mg | ORAL_TABLET | Freq: Three times a day (TID) | ORAL | Status: DC | PRN
  Administered 2023-05-03 (×2): 25 mg via ORAL
  Filled 2023-05-03 (×2): qty 1

## 2023-05-03 MED ORDER — ALUM & MAG HYDROXIDE-SIMETH 200-200-20 MG/5ML PO SUSP
30.0000 mL | ORAL | Status: DC | PRN
  Administered 2023-05-03: 30 mL via ORAL
  Filled 2023-05-03: qty 30

## 2023-05-03 MED ORDER — NICOTINE 14 MG/24HR TD PT24: 14.0000 mg | MEDICATED_PATCH | Freq: Every day | TRANSDERMAL | Status: DC

## 2023-05-03 MED ORDER — ALUM & MAG HYDROXIDE-SIMETH 200-200-20 MG/5ML PO SUSP: 30.0000 mL | ORAL | Status: DC | PRN

## 2023-05-03 MED ORDER — GABAPENTIN 300 MG PO CAPS
300.0000 mg | ORAL_CAPSULE | Freq: Three times a day (TID) | ORAL | Status: DC
  Administered 2023-05-03: 300 mg via ORAL
  Filled 2023-05-03: qty 1
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
## 2023-05-03 NOTE — ED Notes (Signed)
 Patient resting with eyes closed. Respirations even and unlabored. No acute distress noted. Environment secured. Will continue to monitor for safety.
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
## 2023-05-03 NOTE — ED Provider Notes (Cosign Needed Addendum)
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 applies only to samples taken after fasting for at least 8 hours.   BUN 01/21/2023 20  6 - 20 mg/dL Final   Creatinine, Ser 01/21/2023 1.39 (H)  0.61 - 1.24 mg/dL Final   Calcium 52/84/1324 8.2 (L)  8.9 - 10.3 mg/dL Final   GFR, Estimated 01/21/2023 >60  >60 mL/min Final   Comment: (NOTE) Calculated using the CKD-EPI Creatinine Equation (2021)    Anion gap 01/21/2023 9  5 - 15 Final   Performed at [facility], [address] [place], [place] [address]   SARS Coronavirus 2 by RT PCR 01/21/2023 NEGATIVE  NEGATIVE Final    Performed at [facility], [address] [place], [place] [address]   Adenovirus 01/21/2023 NOT DETECTED  NOT DETECTED Final   Coronavirus 229E 01/21/2023 NOT DETECTED  NOT DETECTED Final   Comment: (NOTE) The Coronavirus on the Respiratory Panel, DOES NOT test for the novel  Coronavirus (2019 nCoV)    Coronavirus HKU1 01/21/2023 NOT DETECTED  NOT DETECTED Final   Coronavirus NL63 01/21/2023 NOT DETECTED  NOT DETECTED Final   Coronavirus OC43 01/21/2023 NOT DETECTED  NOT DETECTED Final   Metapneumovirus 01/21/2023 NOT DETECTED  NOT DETECTED Final   Rhinovirus / Enterovirus 01/21/2023 NOT DETECTED  NOT DETECTED Final   Influenza A 01/21/2023 NOT DETECTED  NOT DETECTED Final   Influenza B 01/21/2023 NOT DETECTED  NOT DETECTED Final  Parainfluenza Virus 1 01/21/2023 NOT DETECTED  NOT DETECTED Final   Parainfluenza Virus 2 01/21/2023 NOT DETECTED  NOT DETECTED Final   Parainfluenza Virus 3 01/21/2023 NOT DETECTED  NOT DETECTED Final   Parainfluenza Virus 4 01/21/2023 NOT DETECTED  NOT DETECTED Final   Respiratory Syncytial Virus 01/21/2023 NOT DETECTED  NOT DETECTED Final   Bordetella pertussis 01/21/2023 NOT DETECTED  NOT DETECTED Final   Bordetella Parapertussis 01/21/2023 NOT DETECTED  NOT DETECTED Final   Chlamydophila pneumoniae 01/21/2023 NOT DETECTED  NOT DETECTED Final   Mycoplasma pneumoniae 01/21/2023 NOT DETECTED  NOT DETECTED Final   Performed at [facility], [address] [place], [place] [address]   Sodium 01/22/2023 141  135 - 145 mmol/L Final   Potassium 01/22/2023 3.5  3.5 - 5.1 mmol/L Final   Chloride 01/22/2023 108  98 - 111 mmol/L Final   CO2 01/22/2023 25  22 - 32 mmol/L Final   Glucose, Bld 01/22/2023 102 (H)  70 - 99 mg/dL Final   Glucose reference range applies only to samples taken after fasting for at least 8 hours.   BUN 01/22/2023 17  6 - 20 mg/dL Final   Creatinine, Ser 01/22/2023 1.20  0.61 - 1.24 mg/dL Final   Calcium 91/47/8295 8.2 (L)   8.9 - 10.3 mg/dL Final   GFR, Estimated 01/22/2023 >60  >60 mL/min Final   Comment: (NOTE) Calculated using the CKD-EPI Creatinine Equation (2021)    Anion gap 01/22/2023 8  5 - 15 Final   Performed at [facility], [address] [place], [place] [address]   Glucose-Capillary 01/21/2023 132 (H)  70 - 99 mg/dL Final   Glucose reference range applies only to samples taken after fasting for at least 8 hours.  Admission on 12/21/2022, Discharged on 12/26/2022  Component Date Value Ref Range Status   Sodium 12/21/2022 139  135 - 145 mmol/L Final   Potassium 12/21/2022 3.7  3.5 - 5.1 mmol/L Final   Chloride 12/21/2022 104  98 - 111 mmol/L Final   CO2 12/21/2022 23  22 - 32 mmol/L Final   Glucose, Bld 12/21/2022 152 (H)  70 - 99 mg/dL Final   Glucose reference range applies only to samples taken after fasting for at least 8 hours.   BUN 12/21/2022 7  6 - 20 mg/dL Final   Creatinine, Ser 12/21/2022 1.27 (H)  0.61 - 1.24 mg/dL Final   Calcium 86/57/8469 8.7 (L)  8.9 - 10.3 mg/dL Final   Total Protein 62/95/2841 6.3 (L)  6.5 - 8.1 g/dL Final   Albumin 32/44/0102 3.2 (L)  3.5 - 5.0 g/dL Final   AST 72/53/6644 23  15 - 41 U/L Final   ALT 12/21/2022 25  0 - 44 U/L Final   Alkaline Phosphatase 12/21/2022 53  38 - 126 U/L Final   Total Bilirubin 12/21/2022 0.4  0.3 - 1.2 mg/dL Final   GFR, Estimated 12/21/2022 >60  >60 mL/min Final   Comment: (NOTE) Calculated using the CKD-EPI Creatinine Equation (2021)    Anion gap 12/21/2022 12  5 - 15 Final   Performed at [facility], [address] [place], [place] [address]   Lactic Acid, Venous 12/21/2022 1.3  0.5 - 1.9 mmol/L Final   WBC 12/21/2022 14.2 (H)  4.0 - 10.5 K/uL Final   RBC 12/21/2022 4.29  4.22 - 5.81 MIL/uL Final   Hemoglobin 12/21/2022 12.9 (L)  13.0 - 17.0 g/dL Final   HCT 25/95/6387 38.6 (  L)  39.0 - 52.0 % Final   MCV 12/21/2022 90.0  80.0 - 100.0 fL Final   MCH 12/21/2022 30.1  26.0 - 34.0 pg Final   MCHC 12/21/2022  33.4  30.0 - 36.0 g/dL Final   RDW 16/12/9602 13.3  11.5 - 15.5 % Final   Platelets 12/21/2022 315  150 - 400 K/uL Final   nRBC 12/21/2022 0.0  0.0 - 0.2 % Final   Neutrophils Relative % 12/21/2022 87  % Final   Neutro Abs 12/21/2022 12.2 (H)  1.7 - 7.7 K/uL Final   Lymphocytes Relative 12/21/2022 8  % Final   Lymphs Abs 12/21/2022 1.2  0.7 - 4.0 K/uL Final   Monocytes Relative 12/21/2022 5  % Final   Monocytes Absolute 12/21/2022 0.8  0.1 - 1.0 K/uL Final   Eosinophils Relative 12/21/2022 0  % Final   Eosinophils Absolute 12/21/2022 0.0  0.0 - 0.5 K/uL Final   Basophils Relative 12/21/2022 0  % Final   Basophils Absolute 12/21/2022 0.0  0.0 - 0.1 K/uL Final   Immature Granulocytes 12/21/2022 0  % Final   Abs Immature Granulocytes 12/21/2022 0.05  0.00 - 0.07 K/uL Final   Performed at [facility], [address], [place], [place] [address]   Prothrombin Time 12/21/2022 13.5  11.4 - 15.2 seconds Final   INR 12/21/2022 1.0  0.8 - 1.2 Final   Comment: (NOTE) INR goal varies based on device and disease states. Performed at [facility], [address] [place], [place] [address]    Specimen Description 12/21/2022 BLOOD LEFT ARM   Final   Special Requests 12/21/2022 BOTTLES DRAWN AEROBIC AND ANAEROBIC Blood Culture adequate volume   Final   Culture 12/21/2022    Final                   Value:NO GROWTH 5 DAYS Performed at [facility], [address], [place], [place] [address]    Report Status 12/21/2022 12/26/2022 FINAL   Final   Specimen Description 12/21/2022 BLOOD LEFT ARM   Final   Special Requests 12/21/2022 BOTTLES DRAWN AEROBIC AND ANAEROBIC Blood Culture adequate volume   Final   Culture 12/21/2022    Final                   Value:NO GROWTH 5 DAYS Performed at [facility], [address] [place], [place] [address]    Report Status 12/21/2022 12/26/2022 FINAL   Final   Specimen Source 12/21/2022 URINE, CATHETERIZED   Final   Color, Urine 12/21/2022  YELLOW  YELLOW Final   APPearance 12/21/2022 CLEAR  CLEAR Final   Specific Gravity, Urine 12/21/2022 1.027  1.005 - 1.030 Final   pH 12/21/2022 5.0  5.0 - 8.0 Final   Glucose, UA 12/21/2022 NEGATIVE  NEGATIVE mg/dL Final   Hgb urine dipstick 12/21/2022 SMALL (A)  NEGATIVE Final   Bilirubin Urine 12/21/2022 NEGATIVE  NEGATIVE Final   Ketones, ur 12/21/2022 5 (A)  NEGATIVE mg/dL Final   Protein, ur 86/57/8469 NEGATIVE  NEGATIVE mg/dL Final   Nitrite 62/95/2841 NEGATIVE  NEGATIVE Final   Leukocytes,Ua 12/21/2022 NEGATIVE  NEGATIVE Final   RBC / HPF 12/21/2022 6-10  0 - 5 RBC/hpf Final   WBC, UA 12/21/2022 0-5  0 - 5 WBC/hpf Final   Comment:        Reflex urine culture not performed if WBC <=10, OR if Squamous epithelial cells >5. If Squamous epithelial  cells >5 suggest recollection.    Bacteria, UA 12/21/2022 RARE (A)  NONE SEEN Final   Squamous Epithelial / HPF 12/21/2022 0-5  0 - 5 /HPF Final   Mucus 12/21/2022 PRESENT   Final   Performed at [facility], [address], [place], [place] [address]   CRP 12/21/2022 5.2 (H)  <1.0 mg/dL Final   Performed at [facility], [address] [place] [address] [place], [place] [address]   HIV Screen 4th Generation wRfx 12/22/2022 Non Reactive  Non Reactive Final   Performed at [facility], [address] [place], [place] [address]   Sodium 12/22/2022 139  135 - 145 mmol/L Final   Potassium 12/22/2022 3.5  3.5 - 5.1 mmol/L Final   Chloride 12/22/2022 107  98 - 111 mmol/L Final   CO2 12/22/2022 23  22 - 32 mmol/L Final   Glucose, Bld 12/22/2022 113 (H)  70 - 99 mg/dL Final   Glucose reference range applies only to samples taken after fasting for at least 8 hours.   BUN 12/22/2022 8  6 - 20 mg/dL Final   Creatinine, Ser 12/22/2022 1.22  0.61 - 1.24 mg/dL Final   Calcium 74/25/9563 8.0 (L)  8.9 - 10.3 mg/dL Final   GFR, Estimated 12/22/2022 >60  >60 mL/min Final   Comment: (NOTE) Calculated using the CKD-EPI Creatinine Equation (2021)     Anion gap 12/22/2022 9  5 - 15 Final   Performed at [facility], [address], [place], [place] [address]   WBC 12/22/2022 12.0 (H)  4.0 - 10.5 K/uL Final   RBC 12/22/2022 3.86 (L)  4.22 - 5.81 MIL/uL Final   Hemoglobin 12/22/2022 11.5 (L)  13.0 - 17.0 g/dL Final   HCT 33/29/5188 34.7 (L)  39.0 - 52.0 % Final   MCV 12/22/2022 89.9  80.0 - 100.0 fL Final   MCH 12/22/2022 29.8  26.0 - 34.0 pg Final   MCHC 12/22/2022 33.1  30.0 - 36.0 g/dL Final   RDW 41/66/0630 13.4  11.5 - 15.5 % Final   Platelets 12/22/2022 259  150 - 400 K/uL Final   nRBC 12/22/2022 0.0  0.0 - 0.2 % Final   Performed at [facility], [address], [place], [place] [address]   Hgb A1c MFr Bld 12/22/2022 5.7 (H)  4.8 - 5.6 % Final   Comment: (NOTE) Pre diabetes:          5.7%-6.4%  Diabetes:              >6.4%  Glycemic control for   <7.0% adults with diabetes    Mean Plasma Glucose 12/22/2022 116.89  mg/dL Final   Performed at [facility], [address], [place], [place] [address]   WBC 12/23/2022 11.9 (H)  4.0 - 10.5 K/uL Final   RBC 12/23/2022 3.99 (L)  4.22 - 5.81 MIL/uL Final   Hemoglobin 12/23/2022 12.1 (L)  13.0 - 17.0 g/dL Final   HCT 57/32/2025 36.0 (L)  39.0 - 52.0 % Final   MCV 12/23/2022 90.2  80.0 - 100.0 fL Final   MCH 12/23/2022 30.3  26.0 - 34.0 pg Final   MCHC 12/23/2022 33.6  30.0 - 36.0 g/dL Final   RDW 42/70/6237 13.2  11.5 - 15.5 % Final   Platelets 12/23/2022 269  150 - 400 K/uL Final   nRBC 12/23/2022 0.0  0.0 - 0.2 % Final   Performed at [facility], [address] [place], [place] [address]   Sodium 12/23/2022 139  135 - 145 mmol/L Final   Potassium 12/23/2022 3.5  3.5 - 5.1 mmol/L Final   Chloride 12/23/2022 105  98 - 111 mmol/L Final   CO2 12/23/2022 25  22 - 32 mmol/L Final   Glucose, Bld 12/23/2022 105 (H)  70 - 99 mg/dL Final   Glucose reference range applies only to samples taken after fasting for at least 8 hours.   BUN 12/23/2022 6  6 - 20 mg/dL  Final   Creatinine, Ser 12/23/2022 1.04  0.61 - 1.24 mg/dL Final   Calcium 29/56/2130 8.1 (L)  8.9 - 10.3 mg/dL Final   GFR, Estimated 12/23/2022 >60  >60 mL/min Final   Comment: (NOTE) Calculated using the CKD-EPI Creatinine Equation (2021)    Anion gap 12/23/2022 9  5 - 15 Final   Performed at [facility], [address] [place], [place] [address]   CRP 12/24/2022 6.9 (H)  <1.0 mg/dL Final   Performed at [facility], [address], [place], [place] [address]   WBC 12/24/2022 11.9 (H)  4.0 - 10.5 K/uL Final   RBC 12/24/2022 4.19 (L)  4.22 - 5.81 MIL/uL Final   Hemoglobin 12/24/2022 12.6 (L)  13.0 - 17.0 g/dL Final   HCT 95/28/4132 38.0 (L)  39.0 - 52.0 % Final   MCV 12/24/2022 90.7  80.0 - 100.0 fL Final   MCH 12/24/2022 30.1  26.0 - 34.0 pg Final   MCHC 12/24/2022 33.2  30.0 - 36.0 g/dL Final   RDW 44/03/270 13.1  11.5 - 15.5 % Final   Platelets 12/24/2022 304  150 - 400 K/uL Final   nRBC 12/24/2022 0.0  0.0 - 0.2 % Final   Performed at [facility], [address], [place], [place] [address]   CRP, High Sensitivity 12/24/2022 71.53 (H)  0.00 - 3.00 mg/L Final   Comment: (NOTE) Results confirmed on dilution.         Relative Risk for Future Cardiovascular Event                             Low                 <1.00                             Average       1.00 - 3.00                             High                >3.00 Performed At: [facility] [address] [place], [place] [id number] Jolene Schimke MD [id number]    CRP 12/25/2022 4.6 (H)  <1.0 mg/dL Final   Performed at [facility], [address] [place], [place] [address]   WBC 12/25/2022 9.6  4.0 - 10.5 K/uL Final   RBC 12/25/2022 4.27  4.22 - 5.81 MIL/uL Final   Hemoglobin 12/25/2022 12.4 (L)  13.0 - 17.0 g/dL Final   HCT 16/60/6301 37.6 (L)  39.0 - 52.0 % Final   MCV 12/25/2022 88.1  80.0 - 100.0 fL Final   MCH 12/25/2022 29.0  26.0 - 34.0 pg Final   MCHC 12/25/2022 33.0   30.0 -  36.0 g/dL Final   RDW 78/29/5621 12.8  11.5 - 15.5 % Final   Platelets 12/25/2022 346  150 - 400 K/uL Final   nRBC 12/25/2022 0.0  0.0 - 0.2 % Final   Performed at [facility], [address] [place], [place] [address]   Sodium 12/25/2022 138  135 - 145 mmol/L Final   Potassium 12/25/2022 3.9  3.5 - 5.1 mmol/L Final   Chloride 12/25/2022 102  98 - 111 mmol/L Final   CO2 12/25/2022 25  22 - 32 mmol/L Final   Glucose, Bld 12/25/2022 110 (H)  70 - 99 mg/dL Final   Glucose reference range applies only to samples taken after fasting for at least 8 hours.   BUN 12/25/2022 11  6 - 20 mg/dL Final   Creatinine, Ser 12/25/2022 1.05  0.61 - 1.24 mg/dL Final   Calcium 78/46/9629 8.8 (L)  8.9 - 10.3 mg/dL Final   GFR, Estimated 12/25/2022 >60  >60 mL/min Final   Comment: (NOTE) Calculated using the CKD-EPI Creatinine Equation (2021)    Anion gap 12/25/2022 11  5 - 15 Final   Performed at [facility], [address], [place], [place] [address]   CRP 12/26/2022 2.0 (H)  <1.0 mg/dL Final   Performed at [facility], [address], [place], [place] [address]   WBC 12/26/2022 8.1  4.0 - 10.5 K/uL Final   RBC 12/26/2022 4.53  4.22 - 5.81 MIL/uL Final   Hemoglobin 12/26/2022 13.6  13.0 - 17.0 g/dL Final   HCT 01/11/2535 40.5  39.0 - 52.0 % Final   MCV 12/26/2022 89.4  80.0 - 100.0 fL Final   MCH 12/26/2022 30.0  26.0 - 34.0 pg Final   MCHC 12/26/2022 33.6  30.0 - 36.0 g/dL Final   RDW 64/40/3474 12.7  11.5 - 15.5 % Final   Platelets 12/26/2022 416 (H)  150 - 400 K/uL Final   nRBC 12/26/2022 0.0  0.0 - 0.2 % Final   Performed at [facility], [address], [place], [place] [address]    Blood Alcohol level:  Lab Results  Component Value Date   ETH <10 05/03/2023   ETH <10 01/19/2023    Metabolic Disorder Labs: Lab Results  Component Value Date   HGBA1C 5.1 05/03/2023   MPG 99.67 05/03/2023   MPG 116.89 12/22/2022   No results found for: "PROLACTIN" Lab  Results  Component Value Date   CHOL 174 05/03/2023   TRIG 99 05/03/2023   HDL 66 05/03/2023   CHOLHDL 2.6 05/03/2023   VLDL 20 05/03/2023   LDLCALC 88 05/03/2023    Therapeutic Lab Levels: No results found for: "LITHIUM" No results found for: "VALPROATE" No results found for: "CBMZ"  Physical Findings   GAD-7    Flowsheet Row Office Visit from 02/06/2020 in [facility]  Total GAD-7 Score 1      PHQ2-9    Flowsheet Row Office Visit from 02/06/2020 in [facility]  PHQ-2 Total Score 2  PHQ-9 Total Score 7      Flowsheet Row ED from 05/03/2023 in [facility] ED from 05/02/2023 in [facility] Emergency Department at [facility] ED from 03/27/2023 in [facility] Emergency Department at [facility]  C-SSRS RISK CATEGORY No Risk No Risk No Risk        Musculoskeletal  Strength & Muscle Tone: within normal limits Gait & Station:  normal Patient leans: N/A  Psychiatric Specialty Exam  Presentation  General Appearance:  Disheveled  Eye Contact: Fleeting  Speech: Clear and Coherent  Speech Volume: Normal  Handedness: Right   Mood and Affect  Mood: Labile  Affect: Tearful   Thought Process  Thought Processes: Coherent  Descriptions of Associations:Intact  Orientation:None  Thought Content:Tangential  Diagnosis of Schizophrenia or Schizoaffective disorder in past: No  Duration of Psychotic Symptoms: Less than six months   Hallucinations:Hallucinations: None  Ideas of Reference:None  Suicidal Thoughts:Suicidal Thoughts: No  Homicidal Thoughts:Homicidal Thoughts: No   Sensorium  Memory: Immediate Fair; Recent Fair  Judgment: Fair  Insight: Fair   Art therapist  Concentration: Fair  Attention Span: Fair  Recall: Fair  Fund of Knowledge: Fair  Language: Fair   Psychomotor Activity  Psychomotor  Activity: Psychomotor Activity: Normal   Assets  Assets: Desire for Improvement   Sleep  Sleep: Sleep: Fair Number of Hours of Sleep: 5   Nutritional Assessment (For OBS and FBC admissions only) Has the patient had a weight loss or gain of 10 pounds or more in the last 3 months?: No Has the patient had a decrease in food intake/or appetite?: No Does the patient have dental problems?: No Does the patient have eating habits or behaviors that may be indicators of an eating disorder including binging or inducing vomiting?: No Has the patient recently lost weight without trying?: 0 Has the patient been eating poorly because of a decreased appetite?: 0 Malnutrition Screening Tool Score: 0    Physical Exam  Physical Exam HENT:     Head: Normocephalic.     Nose: Nose normal.  Eyes:     Extraocular Movements: Extraocular movements intact.  Cardiovascular:     Rate and Rhythm: Regular rhythm.  Pulmonary:     Effort: Pulmonary effort is normal.  Musculoskeletal:        General: Normal range of motion.     Cervical back: Normal range of motion.  Skin:    General: Skin is dry.  Neurological:     Mental Status: He is alert.    Review of Systems  Constitutional: Negative.   HENT: Negative.    Eyes: Negative.   Respiratory: Negative.    Cardiovascular: Negative.   Gastrointestinal: Negative.   Genitourinary: Negative.   Musculoskeletal: Negative.   Skin: Negative.   Neurological: Negative.   Psychiatric/Behavioral:  Positive for depression. The patient is nervous/anxious and has insomnia.    Blood pressure (!) 140/86, pulse 84, temperature 98.6 F (37 C), temperature source Oral, resp. rate 18, SpO2 98%. There is no height or weight on file to calculate BMI.  Treatment Plan Summary: Recommendation patient for inpatient admission. Continue to monitor for withdrawal symptoms and/or substance induced psychiatric symptoms.  De Burrs, NP 05/03/2023 10:42 AM

## 2023-05-03 NOTE — Progress Notes (Signed)
   05/03/23 0320  [facility] Triage Screening (Walk-ins at [facility] only)  How Did You Hear About [place]? Self  What Is the Reason for Your Visit/Call Today? Pt presents to [facility] as a voluntary walk-in, unaccompanied with complaint of insomnia, mind racing and feeling that he has a lot going on in his life right now. Pt reports diagnosis of Bipolar and PTSD. Pt is prescribed Seroquel, Prazosin and other medications (unable to remember at this time). Pt denies SI,HI,AVH and substance/alcohol use.  How Long Has This Been Causing You Problems? > than 6 months  Have You Recently Had Any Thoughts About Hurting Yourself? No  Are You Planning to Commit Suicide/Harm Yourself At This time? No  Have you Recently Had Thoughts About Hurting Someone Karolee Ohs? No  Are You Planning To Harm Someone At This Time? No  Physical Abuse Denies  Verbal Abuse Denies  Sexual Abuse Denies  Exploitation of patient/patient's resources Denies  Self-Neglect Denies  Are you currently experiencing any auditory, visual or other hallucinations? No  Have You Used Any Alcohol or Drugs in the Past 24 Hours? No  Do you have any current medical co-morbidities that require immediate attention? No  Clinician description of patient physical appearance/behavior: calm, cooperative, casually dressed  What Do You Feel Would Help You the Most Today? Treatment for Depression or other mood problem  If access to [facility] was not available, would you have sought care in the Emergency Department? No  Determination of Need Routine (7 days)  Options For Referral Other: Comment;Outpatient Therapy;Medication Management
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
## 2023-05-03 NOTE — ED Notes (Signed)
 Patient A&Ox4. Denies intent/thoughts to harm self/others when asked. Denies A/VH. Patient denies any physical complaints when asked. No acute distress noted. Support and encouragement provided. Routine safety checks conducted according to facility protocol. Encouraged patient to notify staff if thoughts of harm toward self or others arise. Endorses safety. Patient verbalized understanding and agreement. Will continue to monitor for safety.

## 2023-05-03 NOTE — ED Provider Notes (Signed)
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 DETECTED Final   Mycoplasma pneumoniae 01/21/2023 NOT DETECTED  NOT DETECTED Final   Performed at [facility], [address], [place], [place] [address]   Sodium 01/22/2023 141  135 - 145 mmol/L Final   Potassium 01/22/2023 3.5  3.5 - 5.1 mmol/L Final   Chloride 01/22/2023 108  98 - 111 mmol/L Final   CO2 01/22/2023 25  22 - 32 mmol/L Final   Glucose, Bld 01/22/2023 102 (H)  70 - 99 mg/dL Final   Glucose reference range applies only to samples taken after  fasting for at least 8 hours.   BUN 01/22/2023 17  6 - 20 mg/dL Final   Creatinine, Ser 01/22/2023 1.20  0.61 - 1.24 mg/dL Final   Calcium 02/72/5366 8.2 (L)  8.9 - 10.3 mg/dL Final   GFR, Estimated 01/22/2023 >60  >60 mL/min Final   Comment: (NOTE) Calculated using the CKD-EPI Creatinine Equation (2021)    Anion gap 01/22/2023 8  5 - 15 Final   Performed at [facility] Lab, [address] [place], [place] [address]   Glucose-Capillary 01/21/2023 132 (H)  70 - 99 mg/dL Final   Glucose reference range applies only to samples taken after fasting for at least 8 hours.  Admission on 12/21/2022, Discharged on 12/26/2022  Component Date Value Ref Range Status   Sodium 12/21/2022 139  135 - 145 mmol/L Final   Potassium 12/21/2022 3.7  3.5 - 5.1 mmol/L Final   Chloride 12/21/2022 104  98 - 111 mmol/L Final   CO2 12/21/2022 23  22 - 32 mmol/L Final   Glucose, Bld 12/21/2022 152 (H)  70 - 99 mg/dL Final   Glucose reference range applies only to samples taken after fasting for at least 8 hours.   BUN 12/21/2022 7  6 - 20 mg/dL Final   Creatinine, Ser 12/21/2022 1.27 (H)  0.61 - 1.24 mg/dL Final   Calcium 74/25/9563 8.7 (L)  8.9 - 10.3 mg/dL Final   Total Protein 87/56/4332 6.3 (L)  6.5 - 8.1 g/dL Final   Albumin 95/18/8416 3.2 (L)  3.5 - 5.0 g/dL Final   AST 60/63/0160 23  15 - 41 U/L Final   ALT 12/21/2022 25  0 - 44 U/L Final   Alkaline Phosphatase 12/21/2022 53  38 - 126 U/L Final   Total Bilirubin 12/21/2022 0.4  0.3 - 1.2 mg/dL Final   GFR, Estimated 12/21/2022 >60  >60 mL/min Final   Comment: (NOTE) Calculated using the CKD-EPI Creatinine Equation (2021)    Anion gap 12/21/2022 12  5 - 15 Final   Performed  at [facility], [address], [place], [place] [address]   Lactic Acid, Venous 12/21/2022 1.3  0.5 - 1.9 mmol/L Final   WBC 12/21/2022 14.2 (H)  4.0 - 10.5 K/uL Final   RBC 12/21/2022 4.29  4.22 - 5.81 MIL/uL Final   Hemoglobin 12/21/2022 12.9 (L)  13.0 - 17.0 g/dL  Final   HCT 60/45/4098 38.6 (L)  39.0 - 52.0 % Final   MCV 12/21/2022 90.0  80.0 - 100.0 fL Final   MCH 12/21/2022 30.1  26.0 - 34.0 pg Final   MCHC 12/21/2022 33.4  30.0 - 36.0 g/dL Final   RDW 11/91/4782 13.3  11.5 - 15.5 % Final   Platelets 12/21/2022 315  150 - 400 K/uL Final   nRBC 12/21/2022 0.0  0.0 - 0.2 % Final   Neutrophils Relative % 12/21/2022 87  % Final   Neutro Abs 12/21/2022 12.2 (H)  1.7 - 7.7 K/uL Final   Lymphocytes Relative 12/21/2022 8  % Final   Lymphs Abs 12/21/2022 1.2  0.7 - 4.0 K/uL Final   Monocytes Relative 12/21/2022 5  % Final   Monocytes Absolute 12/21/2022 0.8  0.1 - 1.0 K/uL Final   Eosinophils Relative 12/21/2022 0  % Final   Eosinophils Absolute 12/21/2022 0.0  0.0 - 0.5 K/uL Final   Basophils Relative 12/21/2022 0  % Final   Basophils Absolute 12/21/2022 0.0  0.0 - 0.1 K/uL Final   Immature Granulocytes 12/21/2022 0  % Final   Abs Immature Granulocytes 12/21/2022 0.05  0.00 - 0.07 K/uL Final   Performed at [facility], [address] [place], [place] [address]   Prothrombin Time 12/21/2022 13.5  11.4 - 15.2 seconds Final   INR 12/21/2022 1.0  0.8 - 1.2 Final   Comment: (NOTE) INR goal varies based on device and disease states. Performed at [facility], [address] [place], [place] [address]    Specimen Description 12/21/2022 BLOOD LEFT ARM   Final   Special Requests 12/21/2022 BOTTLES DRAWN AEROBIC AND ANAEROBIC Blood Culture adequate volume   Final   Culture 12/21/2022    Final                   Value:NO GROWTH 5 DAYS Performed at [facility], [address] [place], [place] [address]    Report Status 12/21/2022 12/26/2022 FINAL   Final   Specimen Description 12/21/2022 BLOOD LEFT ARM   Final   Special Requests 12/21/2022 BOTTLES DRAWN AEROBIC AND ANAEROBIC Blood Culture adequate volume   Final   Culture 12/21/2022    Final                   Value:NO GROWTH 5 DAYS Performed at [facility], [address], [place], [place] [address]    Report Status 12/21/2022 12/26/2022 FINAL   Final   Specimen Source 12/21/2022 URINE, CATHETERIZED   Final   Color, Urine 12/21/2022 YELLOW  YELLOW Final   APPearance 12/21/2022 CLEAR  CLEAR Final   Specific Gravity, Urine 12/21/2022 1.027  1.005 - 1.030 Final   pH 12/21/2022 5.0  5.0 - 8.0 Final   Glucose, UA 12/21/2022 NEGATIVE  NEGATIVE mg/dL Final   Hgb urine dipstick 12/21/2022 SMALL (A)  NEGATIVE Final   Bilirubin Urine 12/21/2022 NEGATIVE  NEGATIVE Final   Ketones, ur 12/21/2022 5 (A)  NEGATIVE mg/dL Final   Protein, ur 84/13/2440 NEGATIVE  NEGATIVE mg/dL Final   Nitrite  12/21/2022 NEGATIVE  NEGATIVE Final   Leukocytes,Ua 12/21/2022 NEGATIVE  NEGATIVE Final   RBC / HPF 12/21/2022 6-10  0 - 5 RBC/hpf Final   WBC, UA 12/21/2022 0-5  0 - 5 WBC/hpf Final   Comment:        Reflex urine culture not performed if WBC <=10, OR if Squamous epithelial cells >5. If Squamous epithelial cells >5 suggest recollection.    Bacteria, UA 12/21/2022 RARE (A)  NONE SEEN Final   Squamous Epithelial / HPF 12/21/2022 0-5  0 - 5 /HPF Final   Mucus 12/21/2022 PRESENT   Final   Performed at [facility], [address] [place], [place] [address]   CRP 12/21/2022 5.2 (H)  <1.0 mg/dL Final   Performed at [facility], [address] [place], [place] [address]   HIV Screen 4th Generation wRfx 12/22/2022 Non Reactive  Non Reactive Final   Performed at [facility], [address] [place], [place] [address]   Sodium 12/22/2022 139  135 - 145 mmol/L Final   Potassium 12/22/2022 3.5  3.5 - 5.1 mmol/L Final   Chloride 12/22/2022 107  98 - 111 mmol/L Final   CO2 12/22/2022 23  22 - 32 mmol/L Final   Glucose, Bld 12/22/2022 113 (H)  70 - 99 mg/dL Final   Glucose reference range applies only to samples taken after fasting for at least 8 hours.   BUN 12/22/2022 8  6 - 20 mg/dL Final   Creatinine, Ser 12/22/2022 1.22  0.61 - 1.24 mg/dL Final   Calcium  13/10/6576 8.0 (L)  8.9 - 10.3 mg/dL Final   GFR, Estimated 12/22/2022 >60  >60 mL/min Final   Comment: (NOTE) Calculated using the CKD-EPI Creatinine Equation (2021)    Anion gap 12/22/2022 9  5 - 15 Final   Performed at [facility], [address] [place], [place] [address]   WBC 12/22/2022 12.0 (H)  4.0 - 10.5 K/uL Final   RBC 12/22/2022 3.86 (L)  4.22 - 5.81 MIL/uL Final   Hemoglobin 12/22/2022 11.5 (L)  13.0 - 17.0 g/dL Final   HCT 95/28/4132 34.7 (L)  39.0 - 52.0 % Final   MCV 12/22/2022 89.9  80.0 - 100.0 fL Final   MCH 12/22/2022 29.8  26.0 - 34.0 pg Final   MCHC 12/22/2022 33.1  30.0 - 36.0 g/dL Final   RDW 44/03/270 13.4  11.5 - 15.5 % Final   Platelets 12/22/2022 259  150 - 400 K/uL Final   nRBC 12/22/2022 0.0  0.0 - 0.2 % Final   Performed at [facility], [address] [place], [place] [address]   Hgb A1c MFr Bld 12/22/2022 5.7 (H)  4.8 - 5.6 % Final   Comment: (NOTE) Pre diabetes:          5.7%-6.4%  Diabetes:              >6.4%  Glycemic control for   <7.0% adults with diabetes    Mean Plasma Glucose 12/22/2022 116.89  mg/dL Final   Performed at [facility], [address] [place], [place] [address]   WBC 12/23/2022 11.9 (H)  4.0 - 10.5 K/uL Final   RBC 12/23/2022 3.99 (L)  4.22 - 5.81 MIL/uL Final   Hemoglobin 12/23/2022 12.1 (L)  13.0 - 17.0 g/dL Final   HCT 42/59/5638 36.0 (L)  39.0 - 52.0 % Final   MCV 12/23/2022 90.2  80.0 - 100.0 fL  Final   MCH 12/23/2022 30.3  26.0 - 34.0 pg Final   MCHC 12/23/2022 33.6  30.0 - 36.0 g/dL Final   RDW 16/12/9602 13.2  11.5 - 15.5 % Final   Platelets 12/23/2022 269  150 - 400 K/uL Final   nRBC 12/23/2022 0.0  0.0 - 0.2 % Final   Performed at [facility], [address], [place], [place] [address]   Sodium 12/23/2022 139  135 - 145 mmol/L Final   Potassium 12/23/2022 3.5  3.5 - 5.1 mmol/L Final   Chloride 12/23/2022 105  98 - 111 mmol/L Final   CO2 12/23/2022 25  22 - 32 mmol/L Final    Glucose, Bld 12/23/2022 105 (H)  70 - 99 mg/dL Final   Glucose reference range applies only to samples taken after fasting for at least 8 hours.   BUN 12/23/2022 6  6 - 20 mg/dL Final   Creatinine, Ser 12/23/2022 1.04  0.61 - 1.24 mg/dL Final   Calcium 11/91/4782 8.1 (L)  8.9 - 10.3 mg/dL Final   GFR, Estimated 12/23/2022 >60  >60 mL/min Final   Comment: (NOTE) Calculated using the CKD-EPI Creatinine Equation (2021)    Anion gap 12/23/2022 9  5 - 15 Final   Performed at [facility], [address], [place], [place] [address]   CRP 12/24/2022 6.9 (H)  <1.0 mg/dL Final   Performed at [facility], [address], [place], [place] [address]   WBC 12/24/2022 11.9 (H)  4.0 - 10.5 K/uL Final   RBC 12/24/2022 4.19 (L)  4.22 - 5.81 MIL/uL Final   Hemoglobin 12/24/2022 12.6 (L)  13.0 - 17.0 g/dL Final   HCT 78/46/9629 38.0 (L)  39.0 - 52.0 % Final   MCV 12/24/2022 90.7  80.0 - 100.0 fL Final   MCH 12/24/2022 30.1  26.0 - 34.0 pg Final   MCHC 12/24/2022 33.2  30.0 - 36.0 g/dL Final   RDW 52/84/1324 13.1  11.5 - 15.5 % Final   Platelets 12/24/2022 304  150 - 400 K/uL Final   nRBC 12/24/2022 0.0  0.0 - 0.2 % Final   Performed at [facility] Lab, [address] [place], [place] [address]   CRP, High Sensitivity 12/24/2022 71.53 (H)  0.00 - 3.00 mg/L Final   Comment: (NOTE) Results confirmed on dilution.         Relative Risk for Future Cardiovascular Event                             Low                 <1.00                             Average       1.00 - 3.00                             High                >3.00 Performed At: [facility] [address] [place], [place] [id number] Jolene Schimke MD [id number]    CRP 12/25/2022 4.6 (H)  <1.0 mg/dL Final   Performed at [facility], [address] [place], [place] [address]   WBC 12/25/2022 9.6  4.0 - 10.5 K/uL Final  RBC 12/25/2022 4.27  4.22 - 5.81 MIL/uL Final   Hemoglobin 12/25/2022 12.4 (L)  13.0 -  17.0 g/dL Final   HCT 16/12/9602 37.6 (L)  39.0 - 52.0 % Final   MCV 12/25/2022 88.1  80.0 - 100.0 fL Final   MCH 12/25/2022 29.0  26.0 - 34.0 pg Final   MCHC 12/25/2022 33.0  30.0 - 36.0 g/dL Final   RDW 54/11/8117 12.8  11.5 - 15.5 % Final   Platelets 12/25/2022 346  150 - 400 K/uL Final   nRBC 12/25/2022 0.0  0.0 - 0.2 % Final   Performed at [facility], [address] [place], [place] [address]   Sodium 12/25/2022 138  135 - 145 mmol/L Final   Potassium 12/25/2022 3.9  3.5 - 5.1 mmol/L Final   Chloride 12/25/2022 102  98 - 111 mmol/L Final   CO2 12/25/2022 25  22 - 32 mmol/L Final   Glucose, Bld 12/25/2022 110 (H)  70 - 99 mg/dL Final   Glucose reference range applies only to samples taken after fasting for at least 8 hours.   BUN 12/25/2022 11  6 - 20 mg/dL Final   Creatinine, Ser 12/25/2022 1.05  0.61 - 1.24 mg/dL Final   Calcium 95/62/1308 8.8 (L)  8.9 - 10.3 mg/dL Final   GFR, Estimated 12/25/2022 >60  >60 mL/min Final   Comment: (NOTE) Calculated using the CKD-EPI Creatinine Equation (2021)    Anion gap 12/25/2022 11  5 - 15 Final   Performed at [facility], [address] [place], [place] [address]   CRP 12/26/2022 2.0 (H)  <1.0 mg/dL Final   Performed at [facility], [address], [place], [place] [address]   WBC 12/26/2022 8.1  4.0 - 10.5 K/uL Final   RBC 12/26/2022 4.53  4.22 - 5.81 MIL/uL Final   Hemoglobin 12/26/2022 13.6  13.0 - 17.0 g/dL Final   HCT 52/84/1324 40.5  39.0 - 52.0 % Final   MCV 12/26/2022 89.4  80.0 - 100.0 fL Final   MCH 12/26/2022 30.0  26.0 - 34.0 pg Final   MCHC 12/26/2022 33.6  30.0 - 36.0 g/dL Final   RDW 40/12/2723 12.7  11.5 - 15.5 % Final   Platelets 12/26/2022 416 (H)  150 - 400 K/uL Final   nRBC 12/26/2022 0.0  0.0 - 0.2 % Final   Performed at [facility], [address], [place], [place] [address]    Allergies: Fish allergy, Other, and Stearyl alcohol  Medications:  Facility Ordered Medications   Medication   acetaminophen (TYLENOL) tablet 650 mg   alum & mag hydroxide-simeth (MAALOX/MYLANTA) 200-200-20 MG/5ML suspension 30 mL   magnesium hydroxide (MILK OF MAGNESIA) suspension 30 mL   traZODone (DESYREL) tablet 50 mg   hydrOXYzine (ATARAX) tablet 25 mg   PTA Medications  Medication Sig   atorvastatin (LIPITOR) 40 MG tablet Take 1 tablet (40 mg total) by mouth daily.   prazosin (MINIPRESS) 2 MG capsule Take 1 capsule (2 mg total) by mouth at bedtime.   QUEtiapine (SEROQUEL) 400 MG tablet Take 1 tablet (400 mg total) by mouth at bedtime.   gabapentin (NEURONTIN) 300 MG capsule Take 1 capsule (300 mg total) by mouth 3 (three) times daily.   acetaminophen (TYLENOL) 325 MG tablet Take 2 tablets (650 mg total) by mouth every 6 (six) hours as needed for mild pain (pain score 1-3) or moderate pain (pain score 4-6).   amLODipine (NORVASC) 10 MG  tablet Take 1 tablet (10 mg total) by mouth daily.   hydrOXYzine (ATARAX) 50 MG tablet Take 1 tablet (50 mg total) by mouth at bedtime.   predniSONE (DELTASONE) 20 MG tablet 3 tabs po daily x 3 days, then 2 tabs x 3 days, then 1.5 tabs x 3 days, then 1 tab x 3 days, then 0.5 tabs x 3 days      Medical Decision Making  Cory Henderson presented to [facility] as a walk in unaccompanied via  [employer] with complaints of worsening depression symptoms and has been off his medications for three weeks.    Recommendations  Based on my evaluation the patient does not appear to have an emergency medical condition. Patient will be admitted to [facility] continuous observation for crisis management, stabilization and safety.  Jasper Riling, NP 05/03/23  4:36 AM

## 2023-05-03 NOTE — Group Note (Signed)
Date:  05/03/2023 Time:  11:24 PM  Group Topic/Focus:  Wrap-Up Group:   The focus of this group is to help patients review their daily goal of treatment and discuss progress on daily workbooks.    Participation Level:  Did Not Attend  Participation Quality:   none  Affect:   none  Cognitive:   none  Insight: None  Engagement in Group:   none  Modes of Intervention:   none  Additional Comments:  none   Belva Crome 05/03/2023, 11:24 PM

## 2023-05-03 NOTE — ED Notes (Signed)
Report given to [facility], RN at [facility].

## 2023-05-03 NOTE — Plan of Care (Signed)
New admission.  Problem: Education: Goal: Knowledge of [facility] Education information/materials will improve Outcome: Not Progressing Goal: Emotional status will improve Outcome: Not Progressing Goal: Mental status will improve Outcome: Not Progressing Goal: Verbalization of understanding the information provided will improve Outcome: Not Progressing   Problem: Activity: Goal: Interest or engagement in activities will improve Outcome: Not Progressing Goal: Sleeping patterns will improve Outcome: Not Progressing   Problem: Coping: Goal: Ability to verbalize frustrations and anger appropriately will improve Outcome: Not Progressing Goal: Ability to demonstrate self-control will improve Outcome: Not Progressing   Problem: Health Behavior/Discharge Planning: Goal: Identification of resources available to assist in meeting health care needs will improve Outcome: Not Progressing Goal: Compliance with treatment plan for underlying cause of condition will improve Outcome: Not Progressing   Problem: Physical Regulation: Goal: Ability to maintain clinical measurements within normal limits will improve Outcome: Not Progressing   Problem: Safety: Goal: Periods of time without injury will increase Outcome: Not Progressing   Problem: Coping: Goal: Coping ability will improve Outcome: Not Progressing Goal: Will verbalize feelings Outcome: Not Progressing   Problem: Education: Goal: Will be free of psychotic symptoms Outcome: Not Progressing Goal: Knowledge of the prescribed therapeutic regimen will improve Outcome: Not Progressing   Problem: Coping: Goal: Coping ability will improve Outcome: Not Progressing   Problem: Self-Concept: Goal: Ability to disclose and discuss suicidal ideas will improve Outcome: Not Progressing

## 2023-05-03 NOTE — Discharge Instructions (Signed)
You are encouraged to follow up with [facility] for outpatient treatment.  Walk in/ Open Access Hours: Monday - Friday 8AM - 11AM (To see provider and therapist) Friday - 1PM - 4PM (To see therapist only)  [facility] [address] [place], [place] [phone] are encouraged to follow up with [facility] for outpatient treatment.  Walk in/ Open Access Hours: Monday - Friday 8AM - 11AM (To see provider and therapist) Friday - 1PM - 4PM (To see therapist only)  [facility] [address] [place], [place] [phone]

## 2023-05-04 NOTE — Progress Notes (Signed)
   05/04/23 0947  Psych Admission Type (Psych Patients Only)  Admission Status Voluntary  Psychosocial Assessment  Patient Complaints Anxiety;Depression  Eye Contact Brief  Facial Expression Blank  Affect Sad  Speech Logical/coherent  Interaction Assertive  Motor Activity Slow  Appearance/Hygiene In scrubs  Behavior Characteristics Cooperative  Mood Sad;Pleasant  Thought Process  Coherency WDL  Content Blaming others;Blaming self  Delusions Paranoid  Perception Hallucinations  Hallucination Auditory;Visual  Judgment Impaired  Confusion None  Danger to Self  Current suicidal ideation? Denies  Agreement Not to Harm Self Yes  Description of Agreement Verbal  Danger to Others  Danger to Others None reported or observed

## 2023-05-04 NOTE — Plan of Care (Signed)
Alert and oriented, RA, c/o pain in fingers, concerned w/home meds being available for bedtime. On-call provider notified and orders have been updated. Q15 min safety checks performed/maintained/ongoing.  Problem: Education: Goal: Knowledge of [facility] Education information/materials will improve Outcome: Not Progressing Goal: Emotional status will improve Outcome: Not Progressing Goal: Mental status will improve Outcome: Not Progressing Goal: Verbalization of understanding the information provided will improve Outcome: Not Progressing   Problem: Activity: Goal: Interest or engagement in activities will improve Outcome: Not Progressing Goal: Sleeping patterns will improve Outcome: Not Progressing   Problem: Coping: Goal: Ability to verbalize frustrations and anger appropriately will improve Outcome: Not Progressing Goal: Ability to demonstrate self-control will improve Outcome: Not Progressing   Problem: Health Behavior/Discharge Planning: Goal: Identification of resources available to assist in meeting health care needs will improve Outcome: Not Progressing Goal: Compliance with treatment plan for underlying cause of condition will improve Outcome: Not Progressing   Problem: Physical Regulation: Goal: Ability to maintain clinical measurements within normal limits will improve Outcome: Not Progressing   Problem: Safety: Goal: Periods of time without injury will increase Outcome: Not Progressing   Problem: Coping: Goal: Coping ability will improve Outcome: Not Progressing Goal: Will verbalize feelings Outcome: Not Progressing   Problem: Education: Goal: Will be free of psychotic symptoms Outcome: Not Progressing Goal: Knowledge of the prescribed therapeutic regimen will improve Outcome: Not Progressing   Problem: Coping: Goal: Coping ability will improve Outcome: Not Progressing   Problem: Self-Concept: Goal: Ability to disclose and discuss suicidal  ideas will improve Outcome: Not Progressing Goal: Will verbalize positive feelings about self Outcome: Not Progressing Note: Patient is initiating therapy. Patient will avoid flare triggers, be monitored by provider to determine if a change in treatment plan is warranted, and be evaluated at upcoming provider appointment to assess progress

## 2023-05-04 NOTE — Group Note (Signed)
Date:  05/04/2023 Time:  10:53 PM  Group Topic/Focus:  Self Care:   The focus of this group is to help patients understand the importance of self-care in order to improve or restore emotional, physical, spiritual, interpersonal, and financial health. Wellness Toolbox:   The focus of this group is to discuss various aspects of wellness, balancing those aspects and exploring ways to increase the ability to experience wellness.  Patients will create a wellness toolbox for use upon discharge. Wrap-Up Group:   The focus of this group is to help patients review their daily goal of treatment and discuss progress on daily workbooks.    Participation Level:  Did Not Attend   Katina Dung 05/04/2023, 10:53 PM

## 2023-05-04 NOTE — Group Note (Signed)
Recreation Therapy Group Note   Group Topic:Coping Skills  Group Date: 05/04/2023 Start Time: 1000 End Time: 1045 Facilitators: Rosina Lowenstein, LRT, CTRS Location:  Craft Room  Group Description: Mind Map.  Patient was provided a blank template of a diagram with 32 blank boxes in a tiered system, branching from the center (similar to a bubble chart). LRT directed patients to label the middle of the diagram "Coping Skills". LRT and patients then came up with 8 different coping skills as examples. Pt were directed to record their coping skills in the 2nd tier boxes closest to the center.  Patients would then share their coping skills with the group as LRT wrote them out. LRT gave a handout of 99 different coping skills at the end of group.   Goal Area(s) Addressed: Patients will be able to define "coping skills". Patient will identify new coping skills.  Patient will increase communication.   Affect/Mood: N/A   Participation Level: Did not attend    Clinical Observations/Individualized Feedback: Patient did not attend group.   Plan: Continue to engage patient in RT group sessions 2-3x/week.   Rosina Lowenstein, LRT, CTRS 05/04/2023 1:51 PM

## 2023-05-04 NOTE — BH IP Treatment Plan (Signed)
Interdisciplinary Treatment and Diagnostic Plan Update  05/04/2023 Time of Session: 13:35 Andrej Spagnoli MRN: [id number]  Principal Diagnosis: Bipolar affective disorder [facility])  Secondary Diagnoses: Principal Problem:   Bipolar affective disorder (HCC)   Current Medications:  Current Facility-Administered Medications  Medication Dose Route Frequency Provider Last Rate Last Admin   acetaminophen (TYLENOL) tablet 650 mg  650 mg Oral Q6H PRN McLauchlin, Marylene Land, NP   650 mg at 05/03/23 2156   alum & mag hydroxide-simeth (MAALOX/MYLANTA) 200-200-20 MG/5ML suspension 30 mL  30 mL Oral Q4H PRN McLauchlin, Angela, NP       haloperidol (HALDOL) tablet 5 mg  5 mg Oral TID PRN McLauchlin, Angela, NP       And   diphenhydrAMINE (BENADRYL) capsule 50 mg  50 mg Oral TID PRN McLauchlin, Angela, NP       haloperidol lactate (HALDOL) injection 5 mg  5 mg Intramuscular TID PRN McLauchlin, Angela, NP       And   diphenhydrAMINE (BENADRYL) injection 50 mg  50 mg Intramuscular TID PRN McLauchlin, Marylene Land, NP       And   LORazepam (ATIVAN) injection 2 mg  2 mg Intramuscular TID PRN McLauchlin, Angela, NP       haloperidol lactate (HALDOL) injection 10 mg  10 mg Intramuscular TID PRN McLauchlin, Angela, NP       And   diphenhydrAMINE (BENADRYL) injection 50 mg  50 mg Intramuscular TID PRN McLauchlin, Angela, NP       And   LORazepam (ATIVAN) injection 2 mg  2 mg Intramuscular TID PRN McLauchlin, Marylene Land, NP       gabapentin (NEURONTIN) capsule 300 mg  300 mg Oral TID Ajibola, Ene A, NP   300 mg at 05/04/23 1218   hydrOXYzine (ATARAX) tablet 25 mg  25 mg Oral TID PRN McLauchlin, Marylene Land, NP   25 mg at 05/03/23 2156   magnesium hydroxide (MILK OF MAGNESIA) suspension 30 mL  30 mL Oral Daily PRN McLauchlin, Angela, NP       nicotine (NICODERM CQ - dosed in mg/24 hours) patch 14 mg  14 mg Transdermal Daily Verner Chol, MD   14 mg at 05/04/23 0947   prazosin (MINIPRESS) capsule 2 mg  2 mg Oral QHS  Ajibola, Ene A, NP   2 mg at 05/03/23 2156   QUEtiapine (SEROQUEL) tablet 400 mg  400 mg Oral QHS Ajibola, Ene A, NP   400 mg at 05/03/23 2157   traZODone (DESYREL) tablet 50 mg  50 mg Oral QHS PRN McLauchlin, Angela, NP       PTA Medications: Medications Prior to Admission  Medication Sig Dispense Refill Last Dose/Taking   gabapentin (NEURONTIN) 300 MG capsule Take 1 capsule (300 mg total) by mouth 3 (three) times daily. 90 capsule 1 05/02/2023   prazosin (MINIPRESS) 2 MG capsule Take 1 capsule (2 mg total) by mouth at bedtime. 30 capsule 0 05/02/2023   QUEtiapine (SEROQUEL) 400 MG tablet Take 1 tablet (400 mg total) by mouth at bedtime. 30 tablet 0 05/02/2023   acetaminophen (TYLENOL) 325 MG tablet Take 2 tablets (650 mg total) by mouth every 6 (six) hours as needed for mild pain (pain score 1-3) or moderate pain (pain score 4-6).      amLODipine (NORVASC) 10 MG tablet Take 1 tablet (10 mg total) by mouth daily. 30 tablet 1    atorvastatin (LIPITOR) 40 MG tablet Take 1 tablet (40 mg total) by mouth daily. 30 tablet 0  hydrOXYzine (ATARAX) 50 MG tablet Take 1 tablet (50 mg total) by mouth at bedtime. 30 tablet 0    predniSONE (DELTASONE) 20 MG tablet Take 20 mg by mouth daily with breakfast. 60 x 3 days, 40 x 3 days, 30 x 3 days, 20 x 3 days, 10 x 3 days       Patient Stressors: Financial difficulties   Health problems   Medication change or noncompliance   Substance abuse    Patient Strengths: Ability for insight  Forensic psychologist fund of knowledge  Motivation for treatment/growth   Treatment Modalities: Medication Management, Group therapy, Case management,  1 to 1 session with clinician, Psychoeducation, Recreational therapy.   Physician Treatment Plan for Primary Diagnosis: Bipolar affective disorder (HCC) Long Term Goal(s):     Short Term Goals:    Medication Management: Evaluate patient's response, side effects, and tolerance of medication  regimen.  Therapeutic Interventions: 1 to 1 sessions, Unit Group sessions and Medication administration.  Evaluation of Outcomes: Not Met  Physician Treatment Plan for Secondary Diagnosis: Principal Problem:   Bipolar affective disorder (HCC)  Long Term Goal(s):     Short Term Goals:       Medication Management: Evaluate patient's response, side effects, and tolerance of medication regimen.  Therapeutic Interventions: 1 to 1 sessions, Unit Group sessions and Medication administration.  Evaluation of Outcomes: Not Met   RN Treatment Plan for Primary Diagnosis: Bipolar affective disorder (HCC) Long Term Goal(s): Knowledge of disease and therapeutic regimen to maintain health will improve  Short Term Goals: Ability to remain free from injury will improve, Ability to verbalize frustration and anger appropriately will improve, Ability to demonstrate self-control, Ability to participate in decision making will improve, Ability to verbalize feelings will improve, Ability to disclose and discuss suicidal ideas, Ability to identify and develop effective coping behaviors will improve, and Compliance with prescribed medications will improve  Medication Management: RN will administer medications as ordered by provider, will assess and evaluate patient's response and provide education to patient for prescribed medication. RN will report any adverse and/or side effects to prescribing provider.  Therapeutic Interventions: 1 on 1 counseling sessions, Psychoeducation, Medication administration, Evaluate responses to treatment, Monitor vital signs and CBGs as ordered, Perform/monitor CIWA, COWS, AIMS and Fall Risk screenings as ordered, Perform wound care treatments as ordered.  Evaluation of Outcomes: Not Met   LCSW Treatment Plan for Primary Diagnosis: Bipolar affective disorder (HCC) Long Term Goal(s): Safe transition to appropriate next level of care at discharge, Engage patient in therapeutic  group addressing interpersonal concerns.  Short Term Goals: Engage patient in aftercare planning with referrals and resources, Increase social support, Increase ability to appropriately verbalize feelings, Increase emotional regulation, Facilitate acceptance of mental health diagnosis and concerns, Facilitate patient progression through stages of change regarding substance use diagnoses and concerns, Identify triggers associated with mental health/substance abuse issues, and Increase skills for wellness and recovery  Therapeutic Interventions: Assess for all discharge needs, 1 to 1 time with Social worker, Explore available resources and support systems, Assess for adequacy in community support network, Educate family and significant other(s) on suicide prevention, Complete Psychosocial Assessment, Interpersonal group therapy.  Evaluation of Outcomes: Not Met   Progress in Treatment: Attending groups: No. Participating in groups: No. Taking medication as prescribed: Yes. Toleration medication: Yes. Family/Significant other contact made: No, will contact:  when given permission. Patient understands diagnosis: Yes. Discussing patient identified problems/goals with staff: Yes. Medical problems stabilized or resolved: Yes. Denies suicidal/homicidal  ideation: Yes. Issues/concerns per patient self-inventory: No. Other: none.  New problem(s) identified: No, Describe:  none identified  New Short Term/Long Term Goal(s): detox, elimination of symptoms of psychosis, medication management for mood stabilization; elimination of SI thoughts; development of comprehensive mental wellness/sobriety plan.  Patient Goals:  Pt declined to meet with treatment team despite invitation by staff.  Discharge Plan or Barriers:  CSW will assist pt with development of an appropriate aftercare/discharge plan.   Reason for Continuation of Hospitalization: Delusions  Depression Medication stabilization Suicidal  ideation  Estimated Length of Stay: 1-7 days  Last 3 [place] Suicide Severity Risk Score: Flowsheet Row Admission (Current) from 05/03/2023 in [facility] INPAT[facility] BE[facility]AL MEDICINE Most recent reading at 05/03/2023  6:00 PM ED from 05/03/2023 in [facility] Most recent reading at 05/03/2023  3:30 AM ED from 05/02/2023 in [facility] Emergency Department [facility] Most recent reading at 05/02/2023 10:16 PM  C-SSRS RISK CATEGORY Low Risk No Risk No Risk       Last PHQ 2/9 Scores:    02/06/2020    2:59 PM  Depression screen PHQ 2/9  Decreased Interest 2  Down, Depressed, Hopeless 0  PHQ - 2 Score 2  Altered sleeping 2  Tired, decreased energy 0  Change in appetite 0  Feeling bad or failure about yourself  2  Trouble concentrating 1  Moving slowly or fidgety/restless 0  Suicidal thoughts 0  PHQ-9 Score 7    Scribe for Treatment Team: Glenis Smoker, [place] 05/04/2023 2:36 PM

## 2023-05-04 NOTE — Plan of Care (Signed)
   Problem: Education: Goal: Emotional status will improve Outcome: Progressing Goal: Mental status will improve Outcome: Progressing Goal: Verbalization of understanding the information provided will improve Outcome: Progressing

## 2023-05-04 NOTE — Group Note (Signed)
[facility] LCSW Group Therapy Note    Group Date: 05/04/2023 Start Time: 1330 End Time: 1430  Type of Therapy and Topic:  Group Therapy:  Overcoming Obstacles  Participation Level:  [facility] PARTICIPATION LEVEL: None  Mood:  Description of Group:   In this group patients will be encouraged to explore what they see as obstacles to their own wellness and recovery. They will be guided to discuss their thoughts, feelings, and behaviors related to these obstacles. The group will process together ways to cope with barriers, with attention given to specific choices patients can make. Each patient will be challenged to identify changes they are motivated to make in order to overcome their obstacles. This group will be process-oriented, with patients participating in exploration of their own experiences as well as giving and receiving support and challenge from other group members.  Therapeutic Goals: 1. Patient will identify personal and current obstacles as they relate to admission. 2. Patient will identify barriers that currently interfere with their wellness or overcoming obstacles.  3. Patient will identify feelings, thought process and behaviors related to these barriers. 4. Patient will identify two changes they are willing to make to overcome these obstacles:    Summary of Patient Progress   X   Therapeutic Modalities:   Cognitive Behavioral Therapy Solution Focused Therapy Motivational Interviewing Relapse Prevention Therapy   Harden Mo, LCSW

## 2023-05-04 NOTE — Progress Notes (Signed)
   05/03/23 2156  Psych Admission Type (Psych Patients Only)  Admission Status Voluntary  Psychosocial Assessment  Patient Complaints Anxiety;Depression;Insomnia;Other (Comment) (Concerns with home meds)  Eye Contact Brief  Facial Expression Sad  Affect Sad  Speech Logical/coherent  Interaction Assertive  Motor Activity Slow  Appearance/Hygiene Unremarkable;In scrubs  Behavior Characteristics Cooperative;Appropriate to situation  Mood Pleasant  Aggressive Behavior  Effect No apparent injury  Thought Process  Coherency WDL  Content Blaming others  Delusions Paranoid  Perception Hallucinations  Hallucination None reported or observed  Judgment WDL  Confusion None  Danger to Self  Current suicidal ideation? Denies  Agreement Not to Harm Self Yes  Description of Agreement Verbal  Danger to Others  Danger to Others None reported or observed

## 2023-05-04 NOTE — Group Note (Signed)
Date:  05/04/2023 Time:  10:25 AM  Group Topic/Focus:  Diagnosis Education:   The focus of this group is to discuss the major disorders that patients maybe diagnosed with.  Group discusses the importance of knowing what one's diagnosis is so that one can understand treatment and better advocate for oneself. Healthy Communication:   The focus of this group is to discuss communication, barriers to communication, as well as healthy ways to communicate with others.    Participation Level:  Did Not Attend   Cory Henderson 05/04/2023, 10:25 AM

## 2023-05-04 NOTE — Progress Notes (Signed)
   05/04/23 0600  15 Minute Checks  Location Bedroom  Visual Appearance Calm  Behavior Sleeping  Sleep (Behavioral Health Patients Only)  Calculate sleep? (Click Yes once per 24 hr at 0600 safety check) Yes  Documented sleep last 24 hours 7.5

## 2023-05-04 NOTE — [facility] Counselor (Signed)
CSW attempted to meet with pt regarding completion of assessment. CSW knocked on door without response and called pt name. Pt did not respond at all. He appeared to be sleeping and in no acute distress. CSW will attempt PSA at a later time.   Vilma Meckel. Algis Greenhouse, MSW, LCSW, LCAS 05/04/2023 4:20 PM
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
## 2023-05-05 DIAGNOSIS — F1290 Cannabis use, unspecified, uncomplicated: Secondary | ICD-10-CM | POA: Insufficient documentation

## 2023-05-05 DIAGNOSIS — F3171 Bipolar disorder, in partial remission, most recent episode hypomanic: Secondary | ICD-10-CM | POA: Diagnosis not present

## 2023-05-05 DIAGNOSIS — F1410 Cocaine abuse, uncomplicated: Secondary | ICD-10-CM | POA: Insufficient documentation

## 2023-05-05 NOTE — Plan of Care (Signed)
  Problem: Safety: Goal: Periods of time without injury will increase Outcome: Progressing   Problem: Activity: Goal: Interest or engagement in activities will improve Outcome: Progressing   Problem: Coping: Goal: Ability to verbalize frustrations and anger appropriately will improve Outcome: Progressing   Problem: Coping: Goal: Ability to demonstrate self-control will improve Outcome: Progressing   Problem: Coping: Goal: Ability to demonstrate self-control will improve Outcome: Progressing   Problem: Education: Goal: Verbalization of understanding the information provided will improve Outcome: Progressing   Problem: Physical Regulation: Goal: Ability to maintain clinical measurements within normal limits will improve Outcome: Progressing

## 2023-05-05 NOTE — Group Note (Signed)
Recreation Therapy Group Note   Group Topic:Goal Setting  Group Date: 05/05/2023 Start Time: 1000 End Time: 1100 Facilitators: Rosina Lowenstein, LRT, CTRS Location:  Craft Room  Group Description: Product/process development scientist. Patients were given many different magazines, a glue stick, markers, and a piece of cardstock paper. LRT and pts discussed the importance of having goals in life. LRT and pts discussed the difference between short-term and long-term goals, as well as what a SMART goal is. LRT encouraged pts to create a vision board, with images they picked and then cut out with safety scissors from the magazine, for themselves, that capture their short and long-term goals. LRT encouraged pts to show and explain their vision board to the group.   Goal Area(s) Addressed:  Patient will gain knowledge of short vs. long term goals.  Patient will identify goals for themselves. Patient will practice setting SMART goals. Patient will verbalize their goals to LRT and peers.   Affect/Mood: N/A   Participation Level: Did not attend    Clinical Observations/Individualized Feedback: Patient did not attend group.   Plan: Continue to engage patient in RT group sessions 2-3x/week.   Rosina Lowenstein, LRT, CTRS 05/05/2023 12:31 PM
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
## 2023-05-05 NOTE — [facility] Suicide Risk Assessment (Signed)
[facility] INPATIENT:  Family/Significant Other Suicide Prevention Education  Suicide Prevention Education:  Education Completed; Cory Henderson/sister [phone]), has been identified by the patient as the family member/significant other with whom the patient will be residing, and identified as the person(s) who will aid the patient in the event of a mental health crisis (suicidal ideations/suicide attempt).  With written consent from the patient, the family member/significant other has been provided the following suicide prevention education, prior to the and/or following the discharge of the patient.  The suicide prevention education provided includes the following: Suicide risk factors Suicide prevention and interventions National Suicide Hotline telephone number [facility] assessment telephone number [facility] Emergency Assistance 911 [facility] and/or Residential Mobile Crisis Unit telephone number  Request made of family/significant other to: Remove weapons (e.g., guns, rifles, knives), all items previously/currently identified as safety concern.   Remove drugs/medications (over-the-counter, prescriptions, illicit drugs), all items previously/currently identified as a safety concern.  The family member/significant other verbalizes understanding of the suicide prevention education information provided.  The family member/significant other agrees to remove the items of safety concern listed above.  Cory Henderson was unaware of pt being in the hospital but shared that it either had to do with is substance use or mental health. She stated that she does believe that pt is a danger to others/himself. Cory Henderson shared that pt is angry and can snap quickly. However, she denied pt having any access to weapons to her knowledge.   Cory Henderson 05/05/2023, 2:19 PM

## 2023-05-05 NOTE — Group Note (Signed)
Date:  05/05/2023 Time:  3:23 PM  Group Topic/Focus:  Activity Group: The focus of the group is to promote activity for the patients and to encourage them to go outside to the courtyard to get some fresh air and some exercise.    Participation Level:  Did Not Attend   Cory Henderson [name] 05/05/2023, 3:23 PM

## 2023-05-05 NOTE — Progress Notes (Signed)
   05/05/23 0819  Psych Admission Type (Psych Patients Only)  Admission Status Voluntary  Psychosocial Assessment  Patient Complaints Anxiety;Depression  Eye Contact Brief  Facial Expression Blank  Affect Sad  Speech Logical/coherent  Interaction Assertive  Motor Activity Slow  Appearance/Hygiene In scrubs  Behavior Characteristics Cooperative;Appropriate to situation  Mood Pleasant  Thought Process  Coherency WDL  Content Blaming others;Blaming self  Delusions Paranoid  Perception Hallucinations  Hallucination Auditory;Visual  Judgment Impaired  Confusion None  Danger to Self  Current suicidal ideation? Denies  Agreement Not to Harm Self Yes  Description of Agreement Verbal  Danger to Others  Danger to Others None reported or observed

## 2023-05-05 NOTE — Group Note (Signed)
Date:  05/05/2023 Time:  10:08 AM  Group Topic/Focus:  Dimensions of Wellness:   The focus of this group is to introduce the topic of wellness and discuss the role each dimension of wellness plays in total health. Self Care:   The focus of this group is to help patients understand the importance of self-care in order to improve or restore emotional, physical, spiritual, interpersonal, and financial health.    Participation Level:  Minimal  Participation Quality:  Drowsy  Affect:  Flat  Cognitive:  Lacking  Insight: Limited  Engagement in Group:  Limited  Modes of Intervention:  Exploration  Additional Comments:    Aahana Elza 05/05/2023, 10:08 AM

## 2023-05-05 NOTE — Group Note (Signed)
[facility] LCSW Group Therapy Note   Group Date: 05/05/2023 Start Time: 1300 End Time: 1400   Type of Therapy/Topic:  Group Therapy:  Emotion Regulation  Participation Level:  Did Not Attend   Mood:  Description of Group:    The purpose of this group is to assist patients in learning to regulate negative emotions and experience positive emotions. Patients will be guided to discuss ways in which they have been vulnerable to their negative emotions. These vulnerabilities will be juxtaposed with experiences of positive emotions or situations, and patients challenged to use positive emotions to combat negative ones. Special emphasis will be placed on coping with negative emotions in conflict situations, and patients will process healthy conflict resolution skills.  Therapeutic Goals: Patient will identify two positive emotions or experiences to reflect on in order to balance out negative emotions:  Patient will label two or more emotions that they find the most difficult to experience:  Patient will be able to demonstrate positive conflict resolution skills through discussion or role plays:   Summary of Patient Progress: Patient did not attend group.     Therapeutic Modalities:   Cognitive Behavioral Therapy Feelings Identification Dialectical Behavioral Therapy   Lowry Ram, LCSW

## 2023-05-05 NOTE — Group Note (Signed)
Date:  05/05/2023 Time:  10:35 PM  Group Topic/Focus:  Wrap-Up Group:   The focus of this group is to help patients review their daily goal of treatment and discuss progress on daily workbooks.    Participation Level:  Active  Participation Quality:  Appropriate  Affect:  Appropriate and Flat  Cognitive:  Alert  Insight: Appropriate  Engagement in Group:  Limited  Modes of Intervention:  Discussion and Orientation  Additional Comments:     [name],Cory Henderson 05/05/2023, 10:35 PM

## 2023-05-05 NOTE — Plan of Care (Signed)
   Problem: Education: Goal: Emotional status will improve Outcome: Progressing Goal: Mental status will improve Outcome: Progressing Goal: Verbalization of understanding the information provided will improve Outcome: Progressing

## 2023-05-05 NOTE — Progress Notes (Signed)
   05/05/2023 0439  Psych Admission Type (Psych Patients Only)  Admission Status Voluntary  Psychosocial Assessment  Patient Complaints Anxiety;Depression  Eye Contact Brief  Facial Expression Blank  Affect Sad  Speech Logical/coherent  Interaction Assertive  Motor Activity Slow  Appearance/Hygiene In scrubs  Behavior Characteristics Cooperative  Mood Sad;Pleasant  Thought Process  Coherency WDL  Content Blaming others;Blaming self  Delusions Paranoid  Perception Hallucinations  Hallucination Auditory;Visual  Judgment Impaired  Confusion None  Danger to Self  Current suicidal ideation? Denies  Agreement Not to Harm Self Yes  Description of Agreement Verbal  Danger to Others  Danger to Others None reported or observed

## 2023-05-06 ENCOUNTER — Other Ambulatory Visit: Payer: Self-pay

## 2023-05-06 DIAGNOSIS — F3171 Bipolar disorder, in partial remission, most recent episode hypomanic: Secondary | ICD-10-CM | POA: Diagnosis not present

## 2023-05-06 MED ORDER — AMLODIPINE BESYLATE 5 MG PO TABS
10.0000 mg | ORAL_TABLET | Freq: Every day | ORAL | Status: DC
  Administered 2023-05-06 – 2023-05-07 (×2): 10 mg via ORAL
  Filled 2023-05-06 (×2): qty 2

## 2023-05-06 MED ORDER — AMLODIPINE BESYLATE 10 MG PO TABS
10.0000 mg | ORAL_TABLET | Freq: Every day | ORAL | 1 refills | Status: DC
  Filled 2023-05-06: qty 30, 30d supply, fill #0

## 2023-05-06 MED ORDER — ATORVASTATIN CALCIUM 20 MG PO TABS
40.0000 mg | ORAL_TABLET | Freq: Every day | ORAL | Status: DC
  Administered 2023-05-06 – 2023-05-07 (×2): 40 mg via ORAL
  Filled 2023-05-06 (×2): qty 2

## 2023-05-06 MED ORDER — HYDROXYZINE HCL 25 MG PO TABS
25.0000 mg | ORAL_TABLET | Freq: Three times a day (TID) | ORAL | 0 refills | Status: AC | PRN
  Filled 2023-05-06: qty 30, 10d supply, fill #0

## 2023-05-06 MED ORDER — ATORVASTATIN CALCIUM 40 MG PO TABS
40.0000 mg | ORAL_TABLET | Freq: Every day | ORAL | 0 refills | Status: DC
  Filled 2023-05-06: qty 30, 30d supply, fill #0

## 2023-05-06 MED ORDER — PRAZOSIN HCL 2 MG PO CAPS
2.0000 mg | ORAL_CAPSULE | Freq: Every day | ORAL | 0 refills | Status: DC
  Filled 2023-05-06: qty 30, 30d supply, fill #0

## 2023-05-06 MED ORDER — GABAPENTIN 300 MG PO CAPS
300.0000 mg | ORAL_CAPSULE | Freq: Three times a day (TID) | ORAL | 0 refills | Status: DC
  Filled 2023-05-06: qty 90, 30d supply, fill #0

## 2023-05-06 MED ORDER — NICOTINE 14 MG/24HR TD PT24
14.0000 mg | MEDICATED_PATCH | Freq: Every day | TRANSDERMAL | 0 refills | Status: AC
  Filled 2023-05-06: qty 28, 28d supply, fill #0

## 2023-05-06 MED ORDER — QUETIAPINE FUMARATE 400 MG PO TABS
400.0000 mg | ORAL_TABLET | Freq: Every day | ORAL | 0 refills | Status: DC
  Filled 2023-05-06: qty 30, 30d supply, fill #0

## 2023-05-06 MED ORDER — TRAZODONE HCL 50 MG PO TABS
50.0000 mg | ORAL_TABLET | Freq: Every evening | ORAL | 0 refills | Status: DC | PRN
  Filled 2023-05-06: qty 30, 30d supply, fill #0

## 2023-05-06 NOTE — Progress Notes (Signed)
Patient denied si,hi and avh. Patient worried about making his court date.   05/05/23 2000  Psych Admission Type (Psych Patients Only)  Admission Status Voluntary  Psychosocial Assessment  Patient Complaints Anxiety  Eye Contact Brief  Facial Expression Blank  Affect Sad  Speech Logical/coherent  Interaction Assertive  Motor Activity Slow  Appearance/Hygiene In scrubs  Behavior Characteristics Cooperative  Mood Pleasant  Aggressive Behavior  Effect No apparent injury  Thought Process  Coherency WDL  Content Blaming others;Blaming self  Delusions Paranoid  Perception Hallucinations  Hallucination Auditory;Visual  Judgment Impaired  Confusion None  Danger to Self  Current suicidal ideation?  (Denies)  Agreement Not to Harm Self Yes  Description of Agreement verbal  Danger to Others  Danger to Others None reported or observed

## 2023-05-06 NOTE — [facility] Counselor (Signed)
CSW was stopped patient who wanted to discuss his discharge.  Pt was requesting a ride to [place].   CSW explained that at this time the patient is not identified for discharge.    Pt stated that he thought he could leave when he wanted since he was not IVC. CSW explained that there is a process and patient would need to speak with NP or psychiatrist to discuss further.  Penni Homans, MSW, LCSW 05/06/2023 1:46 PM

## 2023-05-06 NOTE — Group Note (Signed)
[facility] LCSW Group Therapy Note   Group Date: 05/06/2023 Start Time: 1300 End Time: 1320  Type of Therapy and Topic:  Group Therapy:  Feelings around Relapse and Recovery  Participation Level:  None    Description of Group:    Patients in this group will discuss emotions they experience before and after a relapse. They will process how experiencing these feelings, or avoidance of experiencing them, relates to having a relapse. Facilitator will guide patients to explore emotions they have related to recovery. Patients will be encouraged to process which emotions are more powerful. They will be guided to discuss the emotional reaction significant others in their lives may have to patients' relapse or recovery. Patients will be assisted in exploring ways to respond to the emotions of others without this contributing to a relapse.  Therapeutic Goals: Patient will identify two or more emotions that lead to relapse for them:  Patient will identify two emotions that result when they relapse:  Patient will identify two emotions related to recovery:  Patient will demonstrate ability to communicate their needs through discussion and/or role plays.   Summary of Patient Progress: Patient left soon after the icebreaker. He did not participate in the larger group discussion.    Therapeutic Modalities:   Cognitive Behavioral Therapy Solution-Focused Therapy Assertiveness Training Relapse Prevention Therapy   Glenis Smoker, LCSW

## 2023-05-06 NOTE — [facility] Counselor (Signed)
CSW phone Golda Acre [phone]) to follow up regarding pt disposition. She shared that pt could return to her home upon discharge. No other concerns expressed. Contact ended without incident.   Vilma Meckel. Algis Greenhouse, MSW, LCSW, LCAS 05/06/2023 3:09 PM

## 2023-05-06 NOTE — Plan of Care (Signed)
Patient more visible in the milieu. Stated that he got appointments and court date this Friday and next week. Patient denies SI,HI and AVH. Appropriate with staff & peers. Appetite and energy level good. Support and encouragement given.

## 2023-05-06 NOTE — Group Note (Signed)
Recreation Therapy Group Note   Group Topic:Other  Group Date: 05/06/2023 Start Time: 1000 End Time: 1050 Facilitators: Clinton Gallant, CTRS Location:  Craft Room  Activity Description/Intervention: Therapeutic Drumming. Patients with peers and staff were given the opportunity to engage in a leader facilitated [facility] Group Empowerment Drumming Circle with staff from the [employer], in partnership with [employer]. Teaching laboratory technician and trained [employer], Theodoro Doing leading with LRT observing and documenting intervention and pt response. This evidenced-based practice targets 7 areas of health and wellbeing in the human experience including: stress-reduction, exercise, self-expression, camaraderie/support, nurturing, spirituality, and music-making (leisure).    Goal Area(s) Addresses:  Patient will engage in pro-social way in music group.  Patient will follow directions of drum leader on the first prompt. Patient will demonstrate no behavioral issues during group.  Patient will identify if a reduction in stress level occurs as a result of participation in therapeutic drum circle.    Affect/Mood: N/A   Participation Level: Did not attend    Clinical Observations/Individualized Feedback: Patient did not attend group.   Plan: Continue to engage patient in RT group sessions 2-3x/week.   Rosina Lowenstein, LRT, CTRS 05/06/2023 12:06 PM

## 2023-05-06 NOTE — Plan of Care (Signed)
   Problem: Education: Goal: Knowledge of [place] [facility] Education information/materials will improve Outcome: Progressing Goal: Emotional status will improve Outcome: Progressing
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
## 2023-05-07 DIAGNOSIS — F209 Schizophrenia, unspecified: Secondary | ICD-10-CM | POA: Diagnosis not present
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
## 2023-05-07 NOTE — Progress Notes (Signed)
   05/07/23 0915  Psych Admission Type (Psych Patients Only)  Admission Status Voluntary  Psychosocial Assessment  Patient Complaints None  Eye Contact Fair  Facial Expression Flat  Affect Appropriate to circumstance  Speech Logical/coherent;Soft  Interaction Assertive  Motor Activity Slow;Shuffling  Appearance/Hygiene Unremarkable  Behavior Characteristics Cooperative;Appropriate to situation  Mood Pleasant (patient reports that he is feeling "pretty good" overall.)  Aggressive Behavior  Effect No apparent injury  Thought Process  Coherency WDL  Content WDL  Delusions None reported or observed  Perception WDL  Hallucination None reported or observed  Judgment WDL  Confusion None  Danger to Self  Current suicidal ideation? Denies  Agreement Not to Harm Self Yes  Description of Agreement Verbal  Danger to Others  Danger to Others None reported or observed

## 2023-05-07 NOTE — [facility] Suicide Risk Assessment (Signed)
[facility] Discharge Suicide Risk Assessment   Principal Problem: Schizophrenia [facility]) Discharge Diagnoses: Principal Problem:   Schizophrenia (HCC) Active Problems:   Bipolar affective disorder (HCC)   Cocaine abuse (HCC)   Marijuana use, continuous   Total Time spent with patient: 15 minutes  Musculoskeletal: Strength & Muscle Tone: within normal limits Gait & Station: normal Patient leans: N/A  Psychiatric Specialty Exam  Presentation  General Appearance:  Disheveled  Eye Contact: Minimal  Speech: Clear and Coherent  Speech Volume: Normal  Handedness: Left   Mood and Affect  Mood: Euthymic  Duration of Depression Symptoms: Greater than two weeks  Affect: Congruent; Flat   Thought Process  Thought Processes: Coherent  Descriptions of Associations:Intact  Orientation:Full (Time, Place and Person)  Thought Content:WDL  History of Schizophrenia/Schizoaffective disorder:No  Duration of Psychotic Symptoms:Greater than six months  Hallucinations:Hallucinations: None  Ideas of Reference:None  Suicidal Thoughts:Suicidal Thoughts: No  Homicidal Thoughts:Homicidal Thoughts: No   Sensorium  Memory: Immediate Good; Recent Good; Remote Good  Judgment: Fair  Insight: Fair   Art therapist  Concentration: Fair  Attention Span: Fair  Recall: Good  Fund of Knowledge: Good  Language: Good   Psychomotor Activity  Psychomotor Activity: Psychomotor Activity: Normal   Assets  Assets: Communication Skills; Financial Resources/Insurance   Sleep  Sleep: Sleep: Good Number of Hours of Sleep: 7   Physical Exam: Physical Exam Vitals and nursing note reviewed.  Constitutional:      Appearance: Normal appearance.  HENT:     Head: Normocephalic and atraumatic.     Nose: Nose normal.  Pulmonary:     Effort: Pulmonary effort is normal.  Musculoskeletal:        General: Normal range of motion.  Neurological:     General: No  focal deficit present.     Mental Status: He is alert and oriented to person, place, and time. Mental status is at baseline.  Psychiatric:        Attention and Perception: Attention and perception normal.        Mood and Affect: Mood normal. Affect is flat.        Speech: Speech normal.        Behavior: Behavior normal. Behavior is cooperative.        Thought Content: Thought content normal.        Cognition and Memory: Cognition and memory normal.        Judgment: Judgment is impulsive.    Review of Systems  Musculoskeletal:  Positive for joint pain.  All other systems reviewed and are negative.  Blood pressure 116/79, pulse 80, temperature 98.2 F (36.8 C), resp. rate 20, height 6' (1.829 m), weight 87.1 kg, SpO2 98%. Body mass index is 26.04 kg/m.  Mental Status Per Nursing Assessment::   On Admission:  Self-harm thoughts (patient states that prior to being admitted, dealing with his pain on a daily makes him feel like he wants to end it all.)  Demographic Factors:  Male, Divorced or widowed, Low socioeconomic status, Living alone, and Unemployed  Loss Factors: Decline in physical health, Legal issues, and Financial problems/change in socioeconomic status  Historical Factors: Family history of mental illness or substance abuse and Impulsivity  Risk Reduction Factors:   Living with another person, especially a relative and Positive social support  Continued Clinical Symptoms:  Bipolar Disorder:   Bipolar II Alcohol/Substance Abuse/Dependencies Schizophrenia:   Paranoid or undifferentiated type Medical Diagnoses and Treatments/Surgeries  Cognitive Features That Contribute To Risk:  None  Suicide Risk:  Minimal: No identifiable suicidal ideation.  Patients presenting with no risk factors but with morbid ruminations; may be classified as minimal risk based on the severity of the depressive symptoms   Follow-up Information     [facility] Go to.   Why:  Please Walk-In for crisis services or to register as a new patient: M,W, F 8 AM - 3 PM. Contact information: [address] [place] [address] [phone]                 Plan Of Care/Follow-up recommendations:  Activity:  as tolerated Diet:  heart healthy Seroquel (Quetiapine) 100 mg QHS - For mood stabilization, psychosis, and sleep. Gabapentin 300 mg TID - For neuropathic pain and mood stabilization. Minipress (Prazosin) 2 mg QHS - For PTSD-related nightmares and anxiety. Hydroxyzine 25 mg PRN - For acute anxiety management. Melatonin 5 mg QHS - To support sleep regulation. National Suicide Prevention Lifeline: 988 SAMH[employer] National Helpline (Substance Use & Mental Health): 1-800-662-HELP 540-038-1283) Psychiatric Follow-Up: Scheduled within 7 days to reassess medication response Primary Care Provider (PCP) Follow-Up: To monitor hypertension, chronic pain, and medication side effects.  Myriam Forehand, NP 05/07/2023, 12:36 PM

## 2023-05-07 NOTE — Plan of Care (Signed)
   Problem: Education: Goal: Knowledge of [facility] Education information/materials will improve Outcome: Progressing Goal: Emotional status will improve Outcome: Progressing Goal: Mental status will improve Outcome: Progressing

## 2023-05-07 NOTE — Progress Notes (Signed)
Patient ID: Cory Henderson, male   DOB: Aug 06, 1975, 48 y.o.   MRN: [id number]  Discharge Note:  Patient denies SI/HI/AVH at this time. Discharge instructions, AVS, medication supply, and transition record gone over with patient. Patient given a copy of his Suicide Safety Plan. Patient agrees to comply with medication management, follow-up visit, and outpatient therapy. Patient belongings returned to patient. Patient questions and concerns addressed and answered. Patient ambulatory off unit. Patient discharged to home via [employer] services.

## 2023-05-07 NOTE — Group Note (Signed)
Date:  05/07/2023 Time:  5:01 AM  Group Topic/Focus:  Building Self Esteem:   The Focus of this group is helping patients become aware of the effects of self-esteem on their lives, the things they and others do that enhance or undermine their self-esteem, seeing the relationship between their level of self-esteem and the choices they make and learning ways to enhance self-esteem.    Participation Level:  Active  Participation Quality:  Appropriate and Attentive  Affect:  Appropriate  Cognitive:  Alert and Appropriate  Insight: Appropriate, Good, and Improving  Engagement in Group:  Developing/Improving and Engaged  Modes of Intervention:  Clarification, Discussion, Rapport Building, and Support  Additional Comments:     Bram Hottel 05/07/2023, 5:01 AM

## 2023-05-07 NOTE — Progress Notes (Signed)
   05/07/23 0429  Psych Admission Type (Psych Patients Only)  Admission Status Voluntary  Psychosocial Assessment  Patient Complaints None  Eye Contact Brief  Facial Expression Blank  Affect Sad  Speech Logical/coherent  Interaction Assertive  Motor Activity Slow  Appearance/Hygiene In scrubs  Behavior Characteristics Cooperative  Aggressive Behavior  Effect No apparent injury  Thought Process  Coherency WDL  Content Blaming others  Delusions Paranoid  Perception Hallucinations  Hallucination Auditory;Visual  Judgment Impaired  Confusion None  Danger to Self  Current suicidal ideation?  (Denies)  Agreement Not to Harm Self Yes  Description of Agreement verbal  Danger to Others  Danger to Others None reported or observed

## 2023-05-07 NOTE — Plan of Care (Signed)
  Problem: Education: Goal: Knowledge of [facility] Education information/materials will improve Outcome: Progressing Goal: Emotional status will improve Outcome: Progressing Goal: Mental status will improve Outcome: Progressing Goal: Verbalization of understanding the information provided will improve Outcome: Progressing   Problem: Activity: Goal: Interest or engagement in activities will improve Outcome: Progressing Goal: Sleeping patterns will improve Outcome: Progressing   Problem: Coping: Goal: Ability to verbalize frustrations and anger appropriately will improve Outcome: Progressing Goal: Ability to demonstrate self-control will improve Outcome: Progressing   Problem: Health Behavior/Discharge Planning: Goal: Identification of resources available to assist in meeting health care needs will improve Outcome: Progressing Goal: Compliance with treatment plan for underlying cause of condition will improve Outcome: Progressing   Problem: Physical Regulation: Goal: Ability to maintain clinical measurements within normal limits will improve Outcome: Progressing   Problem: Safety: Goal: Periods of time without injury will increase Outcome: Progressing   Problem: Coping: Goal: Coping ability will improve Outcome: Progressing Goal: Will verbalize feelings Outcome: Progressing   Problem: Education: Goal: Will be free of psychotic symptoms Outcome: Progressing Goal: Knowledge of the prescribed therapeutic regimen will improve Outcome: Progressing   Problem: Coping: Goal: Coping ability will improve Outcome: Progressing   Problem: Self-Concept: Goal: Ability to disclose and discuss suicidal ideas will improve Outcome: Progressing
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
## 2023-05-07 NOTE — Progress Notes (Signed)
  [facility] Adult Case Management Discharge Plan :  Will you be returning to the same living situation after discharge:  Yes,  Patient to return to his sister's home.  At discharge, do you have transportation home?: Yes,  CSW to arrange on patient's behalf.  Do you have the ability to pay for your medications: Yes, [employer]   Release of information consent forms completed and in the chart;  Patient's signature needed at discharge.  Patient to Follow up at:  Follow-up Information     [facility] Go to.   Why: Please Walk-In for crisis services or to register as a new patient: M,W, F 8 AM - 3 PM. Contact information: [address] [place] [address] [phone]                 Next level of care provider has access to [facility] Link:no  Safety Planning and Suicide Prevention discussed: Yes, Danielle Campbell/sister [phone]), has been identified by the patient as the family member/significant other with whom the patient will be residing, and identified as the person(s) who will aid the patient in the event of a mental health crisis (suicidal ideations/suicide attempt).  With written consent from the patient, the family member/significant other has been provided the following suicide prevention education, prior to the and/or following the discharge of the patient.     Has patient been referred to the Quitline?: Patient refused referral for treatment  Patient has been referred for addiction treatment: Yes, the patient will follow up with an outpatient provider for substance use disorder. Psychiatrist/APP: patient to schedule appointment  Lowry Ram, LCSW 05/07/2023, 11:54 AM

## 2023-05-29 ENCOUNTER — Other Ambulatory Visit: Payer: Self-pay

## 2023-05-29 ENCOUNTER — Emergency Department (HOSPITAL_COMMUNITY)
Admission: EM | Admit: 2023-05-29 | Discharge: 2023-05-29 | Disposition: A | Discharge status: Home / Self Care | Attending: Emergency Medicine | Admitting: Emergency Medicine

## 2023-05-29 ENCOUNTER — Encounter (HOSPITAL_COMMUNITY): Payer: Self-pay

## 2023-05-29 ENCOUNTER — Emergency Department (HOSPITAL_COMMUNITY)

## 2023-05-29 DIAGNOSIS — S2232XA Fracture of one rib, left side, initial encounter for closed fracture: Secondary | ICD-10-CM | POA: Insufficient documentation

## 2023-05-29 DIAGNOSIS — S299XXA Unspecified injury of thorax, initial encounter: Secondary | ICD-10-CM | POA: Diagnosis present

## 2023-05-29 MED ORDER — METHOCARBAMOL 500 MG PO TABS: 500.0000 mg | ORAL_TABLET | Freq: Two times a day (BID) | ORAL | 0 refills | Status: DC
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
## 2023-05-29 NOTE — ED Notes (Signed)
 ..  The patient is A&OX4, ambulatory at d/c with independent steady gait, NAD. Pt verbalized understanding of d/c instructions, prescription and follow up care.
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
## 2024-03-26 ENCOUNTER — Emergency Department (HOSPITAL_COMMUNITY)
Admission: EM | Admit: 2024-03-26 | Discharge: 2024-03-26 | Disposition: A | Discharge status: Home / Self Care | Payer: Self-pay | Attending: Emergency Medicine | Admitting: Emergency Medicine

## 2024-03-26 ENCOUNTER — Encounter (HOSPITAL_COMMUNITY): Payer: Self-pay

## 2024-03-26 ENCOUNTER — Other Ambulatory Visit: Payer: Self-pay

## 2024-03-26 ENCOUNTER — Emergency Department (HOSPITAL_COMMUNITY): Payer: Self-pay

## 2024-03-26 DIAGNOSIS — T65891A Toxic effect of other specified substances, accidental (unintentional), initial encounter: Secondary | ICD-10-CM | POA: Insufficient documentation

## 2024-03-26 DIAGNOSIS — S01412A Laceration without foreign body of left cheek and temporomandibular area, initial encounter: Secondary | ICD-10-CM | POA: Insufficient documentation

## 2024-03-26 DIAGNOSIS — H10213 Acute toxic conjunctivitis, bilateral: Secondary | ICD-10-CM | POA: Insufficient documentation

## 2024-03-26 DIAGNOSIS — Z5900 Homelessness unspecified: Secondary | ICD-10-CM | POA: Insufficient documentation

## 2024-03-26 DIAGNOSIS — W228XXA Striking against or struck by other objects, initial encounter: Secondary | ICD-10-CM | POA: Insufficient documentation

## 2024-03-26 MED ORDER — FLUORESCEIN SODIUM 1 MG OP STRP
1.0000 | ORAL_STRIP | Freq: Once | OPHTHALMIC | Status: AC
  Administered 2024-03-26: 1 via OPHTHALMIC
  Filled 2024-03-26: qty 1

## 2024-03-26 MED ORDER — TETRACAINE HCL 0.5 % OP SOLN
1.0000 [drp] | Freq: Once | OPHTHALMIC | Status: AC
  Administered 2024-03-26: 1 [drp] via OPHTHALMIC
  Filled 2024-03-26: qty 4

## 2024-03-26 MED ORDER — ERYTHROMYCIN 5 MG/GM OP OINT
1.0000 | TOPICAL_OINTMENT | Freq: Once | OPHTHALMIC | Status: AC
  Administered 2024-03-26: 1 via OPHTHALMIC
  Filled 2024-03-26: qty 3.5
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
## 2024-03-26 NOTE — Discharge Instructions (Signed)
 Use the ointment as needed for burning of the eyes for the next 24 to 48 hours but all the symptoms should resolve with time.  Do not use any contact lenses or put anything else in your eye.

## 2024-03-26 NOTE — ED Triage Notes (Signed)
 Denies LOC. Axox4.
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
## 2024-03-26 NOTE — ED Notes (Signed)
 Attempted to flush eyes with 20cc NS, pt was unable to tolerate well at this time.

## 2024-04-10 ENCOUNTER — Ambulatory Visit (HOSPITAL_COMMUNITY)
Admission: EM | Admit: 2024-04-10 | Discharge: 2024-04-10 | Disposition: A | Discharge status: Home / Self Care | Attending: Psychiatry | Admitting: Psychiatry

## 2024-04-10 DIAGNOSIS — Z5900 Homelessness unspecified: Secondary | ICD-10-CM | POA: Insufficient documentation

## 2024-04-10 DIAGNOSIS — F209 Schizophrenia, unspecified: Secondary | ICD-10-CM | POA: Insufficient documentation

## 2024-04-10 DIAGNOSIS — F319 Bipolar disorder, unspecified: Secondary | ICD-10-CM | POA: Insufficient documentation

## 2024-04-10 DIAGNOSIS — F1914 Other psychoactive substance abuse with psychoactive substance-induced mood disorder: Secondary | ICD-10-CM | POA: Insufficient documentation

## 2024-04-10 DIAGNOSIS — F1994 Other psychoactive substance use, unspecified with psychoactive substance-induced mood disorder: Secondary | ICD-10-CM

## 2024-04-10 MED ORDER — ALUM & MAG HYDROXIDE-SIMETH 200-200-20 MG/5ML PO SUSP: 30.0000 mL | ORAL | Status: DC | PRN

## 2024-04-10 MED ORDER — ACETAMINOPHEN 325 MG PO TABS: 650.0000 mg | ORAL_TABLET | Freq: Four times a day (QID) | ORAL | Status: DC | PRN

## 2024-04-10 MED ORDER — HALOPERIDOL LACTATE 5 MG/ML IJ SOLN: 10.0000 mg | Freq: Three times a day (TID) | INTRAMUSCULAR | Status: DC | PRN

## 2024-04-10 MED ORDER — DIPHENHYDRAMINE HCL 50 MG/ML IJ SOLN: 50.0000 mg | Freq: Three times a day (TID) | INTRAMUSCULAR | Status: DC | PRN

## 2024-04-10 MED ORDER — MAGNESIUM HYDROXIDE 400 MG/5ML PO SUSP: 30.0000 mL | Freq: Every day | ORAL | Status: DC | PRN

## 2024-04-10 MED ORDER — LORAZEPAM 2 MG/ML IJ SOLN: 2.0000 mg | Freq: Three times a day (TID) | INTRAMUSCULAR | Status: DC | PRN

## 2024-04-10 MED ORDER — HALOPERIDOL LACTATE 5 MG/ML IJ SOLN: 5.0000 mg | Freq: Three times a day (TID) | INTRAMUSCULAR | Status: DC | PRN

## 2024-04-10 MED ORDER — DIPHENHYDRAMINE HCL 50 MG PO CAPS: 50.0000 mg | ORAL_CAPSULE | Freq: Three times a day (TID) | ORAL | Status: DC | PRN

## 2024-04-10 MED ORDER — HALOPERIDOL 5 MG PO TABS: 5.0000 mg | ORAL_TABLET | Freq: Three times a day (TID) | ORAL | Status: DC | PRN

## 2024-04-10 NOTE — Discharge Instructions (Signed)

## 2024-04-10 NOTE — ED Provider Notes (Signed)
 Behavioral Health Urgent Care Medical Screening Exam  Patient Name: Cory Henderson MRN: [id number] Date of Evaluation: 04/10/24 Chief Complaint:   Diagnosis:  Final diagnoses:  Substance induced mood disorder (HCC)  Bipolar I disorder (HCC)  Schizophrenia, unspecified type (HCC)    History of Present illness: Cory Henderson is a 49 y.o. male.  Patient presents to the [facility],  voluntarily as a walk-in, unaccompanied.  He presents with a psychiatric hx of Schizophrenia, Bipolar, PTSD, MDD and Substance use disorder.    Per chart review, patient has had previous treatments in this system. Has also been treated at [facility]. Last inpatient admission to [facility] system was February 2025: he was treated at [facility] when he presented with chief complaint of depression and substance use. Patient was stabilized and discharged with recommendations to follow up at [facility]. His psychiatric  home medications were updated to Gabapentin , Seroquel , Hydroxyzine , Trazodone  and Minipress .    Patient reports he has not been able to follow up as recommended due to his unstable  living conditions.  He reports that he has been homeless for a while and having difficulty to keep up with his mental/medical health needs. Today, he presents with increased depression and anxiety, reporting that he is tired of his lifestyle, that he needs to get myself together. States he was staying at [facility], abusing crack/cocaine. He is unable to return there and refuses to discuss in details.     On approach, the patient is alert and oriented x 4. He is cooperative and  answers questions appropriately. His thought process is logical, speech coherent and tone is normal. Patient is causally dressed and somewhat disheveled with decreased hygiene. Patient denies suicidal thoughts. Denies  self harm behaviors. However, he reports that his living condition is pushing toward giving up on life  if I don't get myself together.   Patient denies SI/HI/AVH. Patient does not appear to be responding to internal, or external stimuli. Patient's drug hx includes: Crack/cocaine, Opioids, and Marijuana. However, he reports he has not been using opioids and Marijuana for a while. Currently using Crack/cocaine. States he smokes all day  and every day. States he is unable to get employment due to his substance abuse problem I have no ID, I have nothing.  He reports he has a court date next month but refuses to provide details.  He reports he is willing to get back on medications. States he is trying to get into a rehab service to work on his drug addiction. He currently has no outpatient services. He denies medical/health concerns.   Patient presents with a hx of multiple visits to this and other systems. He reports a hx of substance abuse treatment but I wasn't very serious. He is given different options for outpatient services. He is willing to return to [facility] services when the clinic opens. He is also given resources about homeless shelters. Emotional support provided.      Flowsheet Row ED from 03/26/2024 in [facility] Emergency Department at [facility] Most recent reading at 03/26/2024 11:31 AM Admission (Discharged) from 05/03/2023 in [facility] INPATIENT BEHAVIORAL MEDICINE Most recent reading at 05/04/2023 10:00 PM ED from 05/03/2023 in [facility] Most recent reading at 05/03/2023  3:30 AM  C-SSRS RISK CATEGORY No Risk Low Risk No Risk    Psychiatric Specialty Exam  Presentation  General Appearance:Disheveled  Eye Contact:Poor  Speech:Pressured  Speech Volume:Decreased  Handedness:Left   Mood and  Affect  Mood: Anxious; Depressed; Hopeless  Affect: Depressed; Tearful   Thought Process  Thought Processes: Coherent  Descriptions of Associations:Intact  Orientation:Full (Time, Place and Person)  Thought Content:WDL  Diagnosis of  Schizophrenia or Schizoaffective disorder in past: No  Duration of Psychotic Symptoms: Greater than six months  Hallucinations:None  Ideas of Reference:None  Suicidal Thoughts:No  Homicidal Thoughts:No   Sensorium  Memory: Immediate Fair; Recent Fair; Remote Fair  Judgment: Fair  Insight: Fair   Art Therapist  Concentration: Fair  Attention Span: Fair  Recall: [employer] of Knowledge: Fair  Language: Fair   Psychomotor Activity  Psychomotor Activity: Normal   Assets  Assets: Manufacturing Systems Engineer; Desire for Improvement   Sleep  Sleep: Fair  Number of hours:  7   Physical Exam: Physical Exam Constitutional:      Appearance: Normal appearance.  HENT:     Head: Normocephalic and atraumatic.     Right Ear: Tympanic membrane normal.     Left Ear: Tympanic membrane normal.     Nose: Nose normal.     Mouth/Throat:     Mouth: Mucous membranes are moist.  Eyes:     Extraocular Movements: Extraocular movements intact.     Pupils: Pupils are equal, round, and reactive to light.  Pulmonary:     Effort: Pulmonary effort is normal.  Musculoskeletal:        General: Normal range of motion.     Cervical back: Normal range of motion and neck supple.  Neurological:     General: No focal deficit present.     Mental Status: He is alert and oriented to person, place, and time.  Psychiatric:        Thought Content: Thought content normal.    Review of Systems  Constitutional: Negative.   HENT: Negative.    Eyes: Negative.   Respiratory: Negative.    Cardiovascular: Negative.   Gastrointestinal: Negative.   Genitourinary: Negative.   Musculoskeletal: Negative.   Skin: Negative.   Neurological: Negative.   Endo/Heme/Allergies: Negative.   Psychiatric/Behavioral:  Positive for depression and substance abuse.    There were no vitals taken for this visit. There is no height or weight on file to calculate BMI.  Musculoskeletal: Strength &  Muscle Tone: within normal limits Gait & Station: normal Patient leans: N/A   [facility] MSE Discharge Disposition for Follow up and Recommendations: Based on my evaluation the patient does not appear to have an emergency medical condition and can be discharged with resources and follow up care in outpatient services for Medication Management, Substance Abuse Intensive Outpatient Program, Individual Therapy, and Group Therapy   Randall Bouquet, NP 04/10/2024, 12:58 PM
